# Patient Record
Sex: Female | Born: 1980 | Race: Black or African American | Hispanic: No | Marital: Single | Smoking: Current some day smoker
Health system: Southern US, Community
[De-identification: ages and names within clinical notes are randomized; demographics above are authoritative.]

## PROBLEM LIST (undated history)

## (undated) DIAGNOSIS — E559 Vitamin D deficiency, unspecified: Secondary | ICD-10-CM

## (undated) HISTORY — PX: ABDOMINAL HYSTERECTOMY: SHX81

## (undated) HISTORY — PX: HYSTEROTOMY: SHX1776

---

## 2015-12-15 ENCOUNTER — Emergency Department (HOSPITAL_COMMUNITY)
Admission: EM | Admit: 2015-12-15 | Discharge: 2015-12-15 | Disposition: A | Discharge status: Home / Self Care | Payer: Managed Care, Other (non HMO) | Attending: Emergency Medicine | Admitting: Emergency Medicine

## 2015-12-15 ENCOUNTER — Emergency Department (HOSPITAL_COMMUNITY): Payer: Managed Care, Other (non HMO)

## 2015-12-15 ENCOUNTER — Encounter (HOSPITAL_COMMUNITY): Payer: Self-pay | Admitting: Emergency Medicine

## 2015-12-15 DIAGNOSIS — Z3202 Encounter for pregnancy test, result negative: Secondary | ICD-10-CM | POA: Diagnosis not present

## 2015-12-15 DIAGNOSIS — R519 Headache, unspecified: Secondary | ICD-10-CM

## 2015-12-15 DIAGNOSIS — F17200 Nicotine dependence, unspecified, uncomplicated: Secondary | ICD-10-CM | POA: Diagnosis not present

## 2015-12-15 DIAGNOSIS — R51 Headache: Secondary | ICD-10-CM | POA: Diagnosis not present

## 2015-12-15 DIAGNOSIS — J3489 Other specified disorders of nose and nasal sinuses: Secondary | ICD-10-CM | POA: Insufficient documentation

## 2015-12-15 LAB — CBC WITH DIFFERENTIAL/PLATELET
BASOS ABS: 0 10*3/uL (ref 0.0–0.1)
BASOS PCT: 0 %
EOS PCT: 1 %
Eosinophils Absolute: 0.1 10*3/uL (ref 0.0–0.7)
HEMATOCRIT: 47.4 % — AB (ref 36.0–46.0)
Hemoglobin: 15.4 g/dL — ABNORMAL HIGH (ref 12.0–15.0)
Lymphocytes Relative: 9 %
Lymphs Abs: 0.8 10*3/uL (ref 0.7–4.0)
MCH: 27.5 pg (ref 26.0–34.0)
MCHC: 32.5 g/dL (ref 30.0–36.0)
MCV: 84.5 fL (ref 78.0–100.0)
MONO ABS: 0.9 10*3/uL (ref 0.1–1.0)
Monocytes Relative: 10 %
NEUTROS ABS: 7.6 10*3/uL (ref 1.7–7.7)
Neutrophils Relative %: 80 %
PLATELETS: 285 10*3/uL (ref 150–400)
RBC: 5.61 MIL/uL — ABNORMAL HIGH (ref 3.87–5.11)
RDW: 15.1 % (ref 11.5–15.5)
WBC: 9.4 10*3/uL (ref 4.0–10.5)

## 2015-12-15 LAB — BASIC METABOLIC PANEL
ANION GAP: 12 (ref 5–15)
BUN: 10 mg/dL (ref 6–20)
CALCIUM: 10 mg/dL (ref 8.9–10.3)
CO2: 29 mmol/L (ref 22–32)
Chloride: 99 mmol/L — ABNORMAL LOW (ref 101–111)
Creatinine, Ser: 0.82 mg/dL (ref 0.44–1.00)
Glucose, Bld: 90 mg/dL (ref 65–99)
Potassium: 4.1 mmol/L (ref 3.5–5.1)
Sodium: 140 mmol/L (ref 135–145)

## 2015-12-15 LAB — I-STAT BETA HCG BLOOD, ED (MC, WL, AP ONLY)

## 2015-12-15 LAB — I-STAT CG4 LACTIC ACID, ED: Lactic Acid, Venous: 1.52 mmol/L (ref 0.5–2.0)

## 2015-12-15 MED ORDER — SODIUM CHLORIDE 0.9 % IV BOLUS (SEPSIS)
1000.0000 mL | Freq: Once | INTRAVENOUS | Status: AC
  Administered 2015-12-15: 1000 mL via INTRAVENOUS

## 2015-12-15 MED ORDER — PROCHLORPERAZINE EDISYLATE 5 MG/ML IJ SOLN
10.0000 mg | Freq: Once | INTRAMUSCULAR | Status: AC
  Administered 2015-12-15: 10 mg via INTRAVENOUS
  Filled 2015-12-15: qty 2

## 2015-12-15 MED ORDER — DEXAMETHASONE SODIUM PHOSPHATE 10 MG/ML IJ SOLN
10.0000 mg | Freq: Once | INTRAMUSCULAR | Status: AC
  Administered 2015-12-15: 10 mg via INTRAVENOUS
  Filled 2015-12-15: qty 1

## 2015-12-15 MED ORDER — BUTALBITAL-APAP-CAFFEINE 50-325-40 MG PO TABS: 1.0000 | ORAL_TABLET | Freq: Four times a day (QID) | ORAL | Status: DC | PRN

## 2015-12-15 NOTE — ED Notes (Signed)
Pt reports HA for the past week. Saw PCP who diagnosed her with tension HAs, was given prescription for muscle relaxers, but it did not help. Went to UC yesterday, who diagnosed her with a sinus infection. Started on abx and a pain medication. Last took new prescription at 1000. Pt was driving in her car a hour ago when she started feeling sob and dizzy along with a feeling of throat swelling. Pt was given 50mg  benedryl by fire prior to EMS arrival which resolved new symptoms. Pt still having HA worse on the R side accompanied by light and sound sensitivity.

## 2015-12-15 NOTE — ED Notes (Signed)
Bed: WA27 Expected date:  Expected time:  Means of arrival:  Comments: EMS- sinus infection

## 2015-12-15 NOTE — Discharge Instructions (Signed)
Use nasal saline (you can try [employer] Simply Saline) at least 4 times a day, use saline 5-10 minutes before using the fluticasone (flonase) nasal spray  Do not use Afrin (Oxymetazoline)  Rest, wash hands frequently  and drink plenty of water.  You may try counter medication such as Mucinex or Sudafed decongestant.  Please follow with your primary care doctor in the next 2 days for a check-up. They must obtain records for further management.   Do not hesitate to return to the Emergency Department for any new, worsening or concerning symptoms.    Sinus Headache A sinus headache occurs when the paranasal sinuses become clogged or swollen. Paranasal sinuses are air pockets within the bones of the face. Sinus headaches can range from mild to severe. CAUSES A sinus headache can result from various conditions that affect the sinuses, such as:  Colds.  Sinus infections.  Allergies. SYMPTOMS The main symptom of this condition is a headache that may feel like pain or pressure in the face, forehead, ears, or upper teeth. People who have a sinus headache often have other symptoms, such as:  Congested or runny nose.  Fever.  Inability to smell. Weather changes can make symptoms worse. DIAGNOSIS This condition may be diagnosed based on:  A physical exam and medical history.  Imaging tests, such as a CT scan and MRI, to check for problems with the sinuses.  A specialist may look into the sinuses with a tool that has a camera (endoscopy). TREATMENT Treatment for this condition depends on the cause.  Sinus pain that is caused by a sinus infection may be treated with antibiotic medicine.  Sinus pain that is caused by allergies may be helped by allergy medicines (antihistamines) and medicated nasal sprays.  Sinus pain that is caused by congestion may be helped by flushing the nose and sinuses with saline solution. HOME CARE INSTRUCTIONS  Take medicines only as directed by your  health care provider.  If you were prescribed an antibiotic medicine, finish all of it even if you start to feel better.  If you have congestion, use a nasal spray to help reduce pressure.  If directed, apply a warm, moist washcloth to your face to help relieve pain. SEEK MEDICAL CARE IF:  You have headaches more than one time each week.  You have sensitivity to light or sound.  You have a fever.  You feel sick to your stomach (nauseous) or you throw up (vomit).  Your headaches do not get better with treatment. Many people think that they have a sinus headache when they actually have migraines or tension headaches. SEEK IMMEDIATE MEDICAL CARE IF:  You have vision problems.  You have sudden, severe pain in your face or head.  You have a seizure.  You are confused.  You have a stiff neck.   This information is not intended to replace advice given to you by your health care provider. Make sure you discuss any questions you have with your health care provider.   Document Released: 01/14/2005 Document Revised: 04/23/2015 Document Reviewed: 12/03/2014 Elsevier Interactive Patient Education [employer]2016 [employer]vier Inc.

## 2015-12-15 NOTE — ED Provider Notes (Signed)
CSN: [id number]     Arrival date & time 12/15/15  1710 History  By signing my name below, I, [facility], attest that this documentation has been prepared under the direction and in the presence of [employer], PA-C. Electronically Signed: Randell Patient, ED Scribe. 12/15/2015. 5:54 PM.    Chief Complaint  Patient presents with  . Headache   The history is provided by the patient. No language interpreter was used.   HPI Comments: Kristen Ramirez is a 34 y.o. female who presents to the Emergency Department complaining of a "10/10", right-sided HA onset 2 weeks ago, worse today. She states that the HA moves and is currently located in the back of the neck, right-side of the head, and behind the right eye. Patient endorses photophobia, sinus pressure, neck pain, nasal congestion, non-productive cough, SOB 1.5 hours ago, and chills despite covering up. Pain is unchanged with pressure but worse when leaning forward. Patient notes similar HAs in the past but reports that current HA is worse in severity. Per patient, she reports that she was seen 1 week ago by her PCP who diagnosed her with tension HAs and was prescribed muscle relaxers which did not relieve her symptoms. She states that she was seen at an urgent care 1 day ago, diagnosed with a sinus infection, and prescribed antibiotics. She has taken the antibiotics without relief. Patient notes that she is concerned she is going to have stroke and die.   History reviewed. No pertinent past medical history. Past Surgical History  Procedure Laterality Date  . Abdominal hysterectomy     History reviewed. No pertinent family history. Social History  Substance Use Topics  . Smoking status: Current Some Day Smoker  . Smokeless tobacco: None  . Alcohol Use: Yes   OB History    No data available     Review of Systems A complete 10 system review of systems was obtained and all systems are negative except as noted in the HPI and  PMH.    Allergies  Review of patient's allergies indicates no known allergies.  Home Medications   Prior to Admission medications   Medication Sig Start Date End Date Taking? Authorizing Provider  butalbital-acetaminophen-caffeine (FIORICET) 50-325-40 MG tablet Take 1 tablet by mouth every 6 (six) hours as needed for headache. 12/15/15   Tyleigh Mahn, PA-C   BP 129/75 mmHg  Pulse 108  Temp(Src) 99.8 F (37.7 C) (Oral)  Resp 16  SpO2 100% Physical Exam  Constitutional: She is oriented to person, place, and time. She appears well-developed and well-nourished.  HENT:  Head: Normocephalic and atraumatic.  Mouth/Throat: Oropharynx is clear and moist.  No drooling or stridor. Posterior pharynx mildly erythematous no significant tonsillar hypertrophy. No exudate. Soft palate rises symmetrically. No TTP or induration under tongue.   Mild tenderness to palpation of bilateral maxillary sinuses.  No mucosal edema in the nares.  Bilateral tympanic membranes with normal architecture and good light reflex.    Eyes: Conjunctivae and EOM are normal. Pupils are equal, round, and reactive to light.  No TTP of maxillary or frontal sinuses  No TTP or induration of temporal arteries bilaterally  Neck: Normal range of motion. Neck supple.  FROM to C-spine. Pt can touch chin to chest without discomfort. No TTP of midline cervical spine.   Cardiovascular: Normal rate, regular rhythm and intact distal pulses.   Pulmonary/Chest: Effort normal and breath sounds normal. No respiratory distress. She has no wheezes. She has no rales. She exhibits  no tenderness.  Abdominal: Soft. Bowel sounds are normal. There is no tenderness.  Musculoskeletal: Normal range of motion. She exhibits no edema or tenderness.  Neurological: She is alert and oriented to person, place, and time. No cranial nerve deficit.  II-Visual fields grossly intact. III/IV/VI-Extraocular movements intact.  Pupils reactive  bilaterally. V/VII-Smile symmetric, equal eyebrow raise,  facial sensation intact VIII- Hearing grossly intact IX/X-Normal gag XI-bilateral shoulder shrug XII-midline tongue extension Motor: 5/5 bilaterally with normal tone and bulk Cerebellar: Normal finger-to-nose  and normal heel-to-shin test.   Romberg negative Ambulates with a coordinated gait   Nursing note and vitals reviewed.   ED Course  Procedures   DIAGNOSTIC STUDIES: Oxygen Saturation is 100% on RA, normal by my interpretation.    COORDINATION OF CARE: 5:50 PM Will order head CT and labs. Discussed treatment plan with pt at bedside and pt agreed to plan.  Labs Review Labs Reviewed  CBC WITH DIFFERENTIAL/PLATELET - Abnormal; Notable for the following:    RBC 5.61 (*)    Hemoglobin 15.4 (*)    HCT 47.4 (*)    All other components within normal limits  BASIC METABOLIC PANEL - Abnormal; Notable for the following:    Chloride 99 (*)    All other components within normal limits  I-STAT CG4 LACTIC ACID, ED  I-STAT BETA HCG BLOOD, ED ([facility], [facility], [facility] ONLY)    Imaging Review Ct Head Wo Contrast  12/15/2015  CLINICAL DATA:  Headache for the last week. EXAM: CT HEAD WITHOUT CONTRAST TECHNIQUE: Contiguous axial images were obtained from the base of the skull through the vertex without intravenous contrast. COMPARISON:  06/24/2013 FINDINGS: Brain: No evidence of acute infarction, hemorrhage, extra-axial collection, ventriculomegaly, or mass effect. Vascular: No hyperdense vessel or unexpected calcification. Skull: Negative for fracture or focal lesion. Sinuses/Orbits: Partial opacification of the left mastoid air cells. Paranasal sinuses and right mastoid air cells are normally aerated. Other: None. IMPRESSION: No acute intracranial abnormality. Partial opacification of the left mastoid air cells, likely infectious or inflammatory. Electronically Signed   By: Ted Mcalpineobrinka  Dimitrova M.D.   On: 12/15/2015 19:02   I have personally  reviewed and evaluated these images and lab results as part of my medical decision-making.   EKG Interpretation None      MDM   Final diagnoses:  Sinus headache    Filed Vitals:   12/15/15 1721  BP: 129/75  Pulse: 108  Temp: 99.8 F (37.7 C)  TempSrc: Oral  Resp: 16  SpO2: 100%    Medications  sodium chloride 0.9 % bolus 1,000 mL (1,000 mLs Intravenous New Bag/Given 12/15/15 1928)  prochlorperazine (COMPAZINE) injection 10 mg (10 mg Intravenous Given 12/15/15 1820)  dexamethasone (DECADRON) injection 10 mg (10 mg Intravenous Given 12/15/15 1928)    Kristen Ramirez is 34 y.o. female presenting with persistent headache over the course of 2 weeks with chills, sinus congestion and starting to feel short of breath and dizzy. Patient is concerned that she is having a stroke. Reassured her with normal neurological exam and she still has concerns, will CT head and check basic blood work. Headache improved with Compazine and fluids. We'll also give Decadron, CT consistent with a maxillary sinus infection, she's taking Ceftin ear, she's only completed 2 of the 10 day course. I've advised her to push fluids, CBC seems hemoconcentrated.  Evaluation does not show pathology that would require ongoing emergent intervention or inpatient treatment. Pt is hemodynamically stable and mentating appropriately. Discussed findings and plan with  patient/guardian, who agrees with care plan. All questions answered. Return precautions discussed and outpatient follow up given.   New Prescriptions   BUTALBITAL-ACETAMINOPHEN-CAFFEINE (FIORICET) 50-325-40 MG TABLET    Take 1 tablet by mouth every 6 (six) hours as needed for headache.   I personally performed the services described in this documentation, which was scribed in my presence.  The recorded information has been reviewed and is accurate.   Wynetta Emery, PA-C 12/15/15 1955  Arby Barrette, MD 12/28/15 [phone]

## 2016-12-31 ENCOUNTER — Encounter (HOSPITAL_COMMUNITY): Payer: Self-pay

## 2016-12-31 ENCOUNTER — Emergency Department (HOSPITAL_COMMUNITY)
Admission: EM | Admit: 2016-12-31 | Discharge: 2016-12-31 | Disposition: A | Discharge status: Home / Self Care | Payer: Managed Care, Other (non HMO) | Attending: Dermatology | Admitting: Dermatology

## 2016-12-31 DIAGNOSIS — Z5321 Procedure and treatment not carried out due to patient leaving prior to being seen by health care provider: Secondary | ICD-10-CM | POA: Insufficient documentation

## 2016-12-31 DIAGNOSIS — R51 Headache: Secondary | ICD-10-CM | POA: Insufficient documentation

## 2016-12-31 HISTORY — DX: Vitamin D deficiency, unspecified: E55.9

## 2016-12-31 NOTE — ED Notes (Signed)
Pt tired of waiting elft

## 2016-12-31 NOTE — ED Triage Notes (Signed)
Pt states that she was at school and started having a numbness sensation in the back of her head, that sensation went to the front of her head and into her eye. Pt states that the feeling caused her to have a panic attack.

## 2020-03-21 ENCOUNTER — Emergency Department (HOSPITAL_COMMUNITY)
Admission: EM | Admit: 2020-03-21 | Discharge: 2020-03-21 | Disposition: A | Discharge status: Home / Self Care | Payer: Managed Care, Other (non HMO) | Attending: Emergency Medicine | Admitting: Emergency Medicine

## 2020-03-21 ENCOUNTER — Other Ambulatory Visit: Payer: Self-pay

## 2020-03-21 ENCOUNTER — Emergency Department (HOSPITAL_COMMUNITY): Payer: Managed Care, Other (non HMO)

## 2020-03-21 ENCOUNTER — Encounter (HOSPITAL_COMMUNITY): Payer: Self-pay | Admitting: Emergency Medicine

## 2020-03-21 DIAGNOSIS — R1010 Upper abdominal pain, unspecified: Secondary | ICD-10-CM | POA: Insufficient documentation

## 2020-03-21 DIAGNOSIS — F17200 Nicotine dependence, unspecified, uncomplicated: Secondary | ICD-10-CM | POA: Diagnosis not present

## 2020-03-21 LAB — I-STAT BETA HCG BLOOD, ED (MC, WL, AP ONLY): I-stat hCG, quantitative: <5 m[IU]/mL (ref <5)

## 2020-03-21 LAB — COMPREHENSIVE METABOLIC PANEL
ALT: 13 U/L (ref 0–44)
AST: 17 U/L (ref 15–41)
Albumin: 4.5 g/dL (ref 3.5–5.0)
Alkaline Phosphatase: 98 U/L (ref 38–126)
Anion gap: 10 (ref 5–15)
BUN: 15 mg/dL (ref 6–20)
CO2: 27 mmol/L (ref 22–32)
Calcium: 9.8 mg/dL (ref 8.9–10.3)
Chloride: 105 mmol/L (ref 98–111)
Creatinine, Ser: 0.66 mg/dL (ref 0.44–1.00)
GFR calc Af Amer: >60 mL/min (ref >60)
GFR calc non Af Amer: >60 mL/min (ref >60)
Glucose, Bld: 102 mg/dL — ABNORMAL HIGH (ref 70–99)
Potassium: 4.1 mmol/L (ref 3.5–5.1)
Sodium: 142 mmol/L (ref 135–145)
Total Bilirubin: 0.4 mg/dL (ref 0.3–1.2)
Total Protein: 7.5 g/dL (ref 6.5–8.1)

## 2020-03-21 LAB — LIPASE, BLOOD: Lipase: 32 U/L (ref 11–51)

## 2020-03-21 LAB — CBC
HCT: 41.8 % (ref 36.0–46.0)
Hemoglobin: 13.2 g/dL (ref 12.0–15.0)
MCH: 26.7 pg (ref 26.0–34.0)
MCHC: 31.6 g/dL (ref 30.0–36.0)
MCV: 84.6 fL (ref 80.0–100.0)
Platelets: 297 10*3/uL (ref 150–400)
RBC: 4.94 MIL/uL (ref 3.87–5.11)
RDW: 15.9 % — ABNORMAL HIGH (ref 11.5–15.5)
WBC: 9.2 10*3/uL (ref 4.0–10.5)
nRBC: 0 % (ref 0.0–0.2)

## 2020-03-21 LAB — URINALYSIS, ROUTINE W REFLEX MICROSCOPIC
Bilirubin Urine: NEGATIVE
Glucose, UA: NEGATIVE mg/dL
Hgb urine dipstick: NEGATIVE
Ketones, ur: NEGATIVE mg/dL
Leukocytes,Ua: NEGATIVE
Nitrite: NEGATIVE
Protein, ur: NEGATIVE mg/dL
Specific Gravity, Urine: 1.019 (ref 1.005–1.030)
pH: 6 (ref 5.0–8.0)

## 2020-03-21 MED ORDER — SUCRALFATE 1 G PO TABS: 1.0000 g | ORAL_TABLET | Freq: Three times a day (TID) | ORAL | 0 refills | Status: DC

## 2020-03-21 MED ORDER — FAMOTIDINE 20 MG PO TABS: 20.0000 mg | ORAL_TABLET | Freq: Two times a day (BID) | ORAL | 0 refills | Status: DC

## 2020-03-21 MED ORDER — FAMOTIDINE IN NACL 20-0.9 MG/50ML-% IV SOLN
20.0000 mg | Freq: Once | INTRAVENOUS | Status: AC
  Administered 2020-03-21: 20 mg via INTRAVENOUS
  Filled 2020-03-21: qty 50

## 2020-03-21 MED ORDER — SODIUM CHLORIDE (PF) 0.9 % IJ SOLN
INTRAMUSCULAR | Status: AC
  Filled 2020-03-21: qty 50

## 2020-03-21 MED ORDER — IOHEXOL 300 MG/ML  SOLN
100.0000 mL | Freq: Once | INTRAMUSCULAR | Status: AC | PRN
  Administered 2020-03-21: 100 mL via INTRAVENOUS

## 2020-03-21 MED ORDER — LIDOCAINE VISCOUS HCL 2 % MT SOLN
15.0000 mL | Freq: Once | OROMUCOSAL | Status: AC
  Administered 2020-03-21: 15 mL via ORAL
  Filled 2020-03-21: qty 15

## 2020-03-21 MED ORDER — ALUM & MAG HYDROXIDE-SIMETH 200-200-20 MG/5ML PO SUSP
30.0000 mL | Freq: Once | ORAL | Status: AC
  Administered 2020-03-21: 30 mL via ORAL
  Filled 2020-03-21: qty 30

## 2020-03-21 MED ORDER — SODIUM CHLORIDE 0.9% FLUSH: 3.0000 mL | Freq: Once | INTRAVENOUS | Status: DC

## 2020-03-21 NOTE — Discharge Instructions (Addendum)
1. Medications: Start taking Pepcid twice daily with meals.  You can also take Carafate with meals and at night which helps coat the stomach to protect it. 2. Treatment: rest, drink plenty of fluids, advance diet slowly.  Avoid spicy foods, fried foods, fatty foods, or alcohol.  Avoid eating too close to bedtime, usually recommend 2 hours between your last meal and bedtime. You can also elevate the head of the bed.  3. Follow Up: Please followup with your primary doctor in 3 days for discussion of your diagnoses and further evaluation after today's visit; if you do not have a primary care doctor use the resource guide provided to find one; Please return to the ER for persistent vomiting, high fevers or worsening symptoms

## 2020-03-21 NOTE — ED Provider Notes (Signed)
[facility]-EMERGENCY DEPT Provider Note   CSN: [id number] Arrival date & time: 03/21/20  1301     History Chief Complaint  Patient presents with  . Abdominal Pain    Kristen Ramirez is a 39 y.o. female with history of vitamin D deficiency presents for evaluation of acute onset, persistent upper abdominal pain for 2 weeks.  She has a difficult time describing the pain but states that it sometimes worsens after meals.  She denies nausea, vomiting, or diarrhea.  She denies urinary symptoms, fevers, chest pain, shortness of breath.  Denies suspicious food intake, known sick contacts recent travel.  She has tried an herbal supplement for her symptoms with no relief.  She went to urgent care and was sent here for further evaluation.   The history is provided by the patient.       Past Medical History:  Diagnosis Date  . Vitamin D deficiency     There are no problems to display for this patient.   Past Surgical History:  Procedure Laterality Date  . ABDOMINAL HYSTERECTOMY    . HYSTEROTOMY       OB History   No obstetric history on file.     No family history on file.  Social History   Tobacco Use  . Smoking status: Current Some Day Smoker  . Smokeless tobacco: Never Used  Substance Use Topics  . Alcohol use: Yes  . Drug use: No    Home Medications Prior to Admission medications   Medication Sig Start Date End Date Taking? Authorizing Provider  butalbital-acetaminophen-caffeine (FIORICET) 50-325-40 MG tablet Take 1 tablet by mouth every 6 (six) hours as needed for headache. 12/15/15   Pisciotta, Joni Reining, PA-C  famotidine (PEPCID) 20 MG tablet Take 1 tablet (20 mg total) by mouth 2 (two) times daily. 03/21/20   Uliana Brinker A, PA-C  sucralfate (CARAFATE) 1 g tablet Take 1 tablet (1 g total) by mouth 4 (four) times daily -  with meals and at bedtime. 03/21/20   Michela Pitcher A, PA-C    Allergies    Patient has no known allergies.  Review of  Systems   Review of Systems  Constitutional: Negative for chills and fever.  Respiratory: Negative for shortness of breath.   Cardiovascular: Negative for chest pain.  Gastrointestinal: Positive for abdominal pain and constipation (baseline for patient). Negative for diarrhea, nausea and vomiting.  Genitourinary: Negative for dysuria, frequency, hematuria and urgency.  All other systems reviewed and are negative.   Physical Exam Updated Vital Signs BP 137/87 (BP Location: Left Arm)   Pulse 80   Temp 98.6 F (37 C) (Oral)   Resp 16   SpO2 100%   Physical Exam Vitals and nursing note reviewed.  Constitutional:      General: She is not in acute distress.    Appearance: She is well-developed.  HENT:     Head: Normocephalic and atraumatic.  Eyes:     General:        Right eye: No discharge.        Left eye: No discharge.     Conjunctiva/sclera: Conjunctivae normal.  Neck:     Vascular: No JVD.     Trachea: No tracheal deviation.  Cardiovascular:     Rate and Rhythm: Normal rate and regular rhythm.  Pulmonary:     Effort: Pulmonary effort is normal.     Breath sounds: Normal breath sounds.  Abdominal:     General: Bowel sounds are  normal. There is no distension.     Palpations: Abdomen is soft.     Tenderness: There is abdominal tenderness in the right upper quadrant, epigastric area and left upper quadrant. There is no right CVA tenderness, left CVA tenderness, guarding or rebound. Negative signs include Murphy's sign, Rovsing's sign and McBurney's sign.     Comments: Mild abdominal discomfort on palpation  Skin:    General: Skin is warm and dry.     Findings: No erythema.  Neurological:     Mental Status: She is alert.  Psychiatric:        Behavior: Behavior normal.     ED Results / Procedures / Treatments   Labs (all labs ordered are listed, but only abnormal results are displayed) Labs Reviewed  COMPREHENSIVE METABOLIC PANEL - Abnormal; Notable for the  following components:      Result Value   Glucose, Bld 102 (*)    All other components within normal limits  CBC - Abnormal; Notable for the following components:   RDW 15.9 (*)    All other components within normal limits  LIPASE, BLOOD  URINALYSIS, ROUTINE W REFLEX MICROSCOPIC  I-STAT BETA HCG BLOOD, ED ([facility], [facility], [facility] ONLY)    EKG None  Radiology CT ABDOMEN PELVIS W CONTRAST  Result Date: 03/21/2020 CLINICAL DATA:  Right upper quadrant pain, generalized pain for 2 weeks EXAM: CT ABDOMEN AND PELVIS WITH CONTRAST TECHNIQUE: Multidetector CT imaging of the abdomen and pelvis was performed using the standard protocol following bolus administration of intravenous contrast. CONTRAST:  OMNIPAQUE IOHEXOL 300 MG/ML  SOLN COMPARISON:  None. FINDINGS: Lower chest: No acute pleural or parenchymal lung disease. Hepatobiliary: No focal liver abnormality is seen. No gallstones, gallbladder wall thickening, or biliary dilatation. Pancreas: Unremarkable. No pancreatic ductal dilatation or surrounding inflammatory changes. Spleen: Normal in size without focal abnormality. Adrenals/Urinary Tract: Nonspecific left adrenal thickening measures 14 mm. The right adrenal is unremarkable. The kidneys enhance normally and symmetrically. No urinary tract calculi or obstructive uropathy. The bladder is unremarkable. Stomach/Bowel: Normal gas-filled appendix right lower quadrant. No bowel obstruction or ileus. Vascular/Lymphatic: No significant vascular findings are present. No enlarged abdominal or pelvic lymph nodes. Reproductive: Status post hysterectomy. No adnexal masses. Other: No abdominal wall hernia or abnormality. No abdominopelvic ascites. Musculoskeletal: No acute or destructive bony lesions. Reconstructed images demonstrate no additional findings. IMPRESSION: 1. No urinary tract calculi or obstructive uropathy. 2. Nonspecific left adrenal thickening measures 14 mm. Statistically this would likely reflect an  adenoma. If further imaging is clinically indicated, nonemergent MRI could be considered. Electronically Signed   By: Sharlet Salina M.D.   On: 03/21/2020 18:29    Procedures Procedures (including critical care time)  Medications Ordered in ED Medications  sodium chloride flush (NS) 0.9 % injection 3 mL (has no administration in time range)  sodium chloride (PF) 0.9 % injection (has no administration in time range)  famotidine (PEPCID) IVPB 20 mg premix (0 mg Intravenous Stopped 03/21/20 1800)  iohexol (OMNIPAQUE) 300 MG/ML solution 100 mL (100 mLs Intravenous Contrast Given 03/21/20 1805)  alum & mag hydroxide-simeth (MAALOX/MYLANTA) 200-200-20 MG/5ML suspension 30 mL (30 mLs Oral Given 03/21/20 1843)    And  lidocaine (XYLOCAINE) 2 % viscous mouth solution 15 mL (15 mLs Oral Given 03/21/20 1843)    ED Course  I have reviewed the triage vital signs and the nursing notes.  Pertinent labs & imaging results that were available during my care of the patient were reviewed by me  and considered in my medical decision making (see chart for details).    MDM Rules/Calculators/A&P                      Patient presenting for evaluation of 2 weeks of upper abdominal pain.  No other associated symptoms.  She signs are stable.  She is nontoxic in appearance.  No rebound or guarding noted on examination of the abdomen.  Lab work reviewed and interpreted by myself shows no leukocytosis, no anemia, no metabolic derangements, no renal insufficiency.  UA does not suggest UTI or nephrolithiasis.  She has no GU complaints and I doubt ectopic pregnancy, PID, TOA or ovarian torsion.  Imaging today shows no evidence of acute surgical abdominal pathology.  She was given GI cocktail and Pepcid in the ED on reevaluation reports she is feeling better.  She is tolerating sips of fluids in the ED and serial abdominal examinations remain benign.  She does have a PCP with whom she can follow-up.  I suspect that she may have GERD  versus peptic ulcer disease possibly as her symptoms worsen with meals.  We will start her on a course of Carafate and Pepcid.  We discussed lifestyle modifications and dietary modifications.  She will follow-up with her PCP on an outpatient basis for reevaluation of her symptoms.  Discussed strict ED return precautions.  Patient verbalized understanding of and agreement with plan and stable for discharge at this time.   Final Clinical Impression(s) / ED Diagnoses Final diagnoses:  Pain of upper abdomen    Rx / DC Orders ED Discharge Orders         Ordered    famotidine (PEPCID) 20 MG tablet  2 times daily     03/21/20 1921    sucralfate (CARAFATE) 1 g tablet  3 times daily with meals & bedtime     03/21/20 1921           Renita Papa, PA-C 03/21/20 1924    Deno Etienne, DO 03/21/20 2003

## 2020-03-21 NOTE — ED Notes (Signed)
Pt transported to CT ?

## 2020-03-21 NOTE — ED Triage Notes (Addendum)
Per patient, states she has been having generalized abdominal pain for 2 weeks-went to UC but they sent her here-no N/V/D

## 2020-04-01 ENCOUNTER — Ambulatory Visit: Payer: Managed Care, Other (non HMO) | Attending: Internal Medicine

## 2020-04-01 DIAGNOSIS — Z20822 Contact with and (suspected) exposure to covid-19: Secondary | ICD-10-CM

## 2020-04-02 LAB — NOVEL CORONAVIRUS, NAA: SARS-CoV-2, NAA: DETECTED — AB

## 2020-04-02 LAB — SARS-COV-2, NAA 2 DAY TAT

## 2020-04-10 ENCOUNTER — Other Ambulatory Visit: Payer: Self-pay

## 2020-04-10 ENCOUNTER — Ambulatory Visit: Payer: Managed Care, Other (non HMO)

## 2020-12-09 ENCOUNTER — Emergency Department (HOSPITAL_COMMUNITY): Payer: Managed Care, Other (non HMO)

## 2020-12-09 ENCOUNTER — Encounter (HOSPITAL_COMMUNITY): Payer: Self-pay | Admitting: Emergency Medicine

## 2020-12-09 ENCOUNTER — Other Ambulatory Visit: Payer: Self-pay

## 2020-12-09 ENCOUNTER — Emergency Department (HOSPITAL_COMMUNITY)
Admission: EM | Admit: 2020-12-09 | Discharge: 2020-12-09 | Disposition: A | Discharge status: Home / Self Care | Payer: Managed Care, Other (non HMO) | Attending: Emergency Medicine | Admitting: Emergency Medicine

## 2020-12-09 DIAGNOSIS — F17200 Nicotine dependence, unspecified, uncomplicated: Secondary | ICD-10-CM | POA: Insufficient documentation

## 2020-12-09 DIAGNOSIS — R519 Headache, unspecified: Secondary | ICD-10-CM

## 2020-12-09 DIAGNOSIS — M542 Cervicalgia: Secondary | ICD-10-CM | POA: Insufficient documentation

## 2020-12-09 MED ORDER — KETOROLAC TROMETHAMINE 60 MG/2ML IM SOLN
60.0000 mg | Freq: Once | INTRAMUSCULAR | Status: AC
  Administered 2020-12-09: 60 mg via INTRAMUSCULAR
  Filled 2020-12-09: qty 2

## 2020-12-09 MED ORDER — TRAMADOL HCL 50 MG PO TABS: 50.0000 mg | ORAL_TABLET | Freq: Four times a day (QID) | ORAL | 0 refills | Status: DC | PRN

## 2020-12-09 MED ORDER — OXYCODONE-ACETAMINOPHEN 5-325 MG PO TABS
2.0000 | ORAL_TABLET | Freq: Once | ORAL | Status: DC
  Filled 2020-12-09: qty 2

## 2020-12-09 MED ORDER — DIAZEPAM 5 MG PO TABS
5.0000 mg | ORAL_TABLET | Freq: Once | ORAL | Status: AC
  Administered 2020-12-09: 22:00:00 5 mg via ORAL
  Filled 2020-12-09: qty 1

## 2020-12-09 MED ORDER — METHOCARBAMOL 500 MG PO TABS: 500.0000 mg | ORAL_TABLET | Freq: Two times a day (BID) | ORAL | 0 refills | Status: DC

## 2020-12-09 NOTE — ED Triage Notes (Signed)
Patient headache with light sensitivity and intermittent nausea x3 weeks. Denies vomiting, fevers, and cough.

## 2020-12-09 NOTE — ED Provider Notes (Signed)
Kristen Ramirez   CSN: [id number] Arrival date & time: 12/09/20  1843     History Chief Complaint  Patient presents with  . Headache    Kristen Ramirez is a 39 y.o. female.  39 year old female presents with 2 weeks of headache which starts at the left side of her neck and radiates to her occiput.  States that it is positional.  Works as a [employer] and uses her left arm and awkward positions.  Denies any nausea or vomiting.  No left hand weakness.  Patient does see a chiropractor and got adjusted 3 days ago.  Pain is better with remaining still.  She is also been using Excedrin without relief.  Denies any fever.  No meningismus.        Past Medical History:  Diagnosis Date  . Vitamin D deficiency     There are no problems to display for this patient.   Past Surgical History:  Procedure Laterality Date  . ABDOMINAL HYSTERECTOMY    . HYSTEROTOMY       OB History   No obstetric history on file.     No family history on file.  Social History   Tobacco Use  . Smoking status: Current Some Day Smoker  . Smokeless tobacco: Never Used  Substance Use Topics  . Alcohol use: Yes  . Drug use: No    Home Medications Prior to Admission medications   Medication Sig Start Date End Date Taking? Authorizing Provider  butalbital-acetaminophen-caffeine (FIORICET) 50-325-40 MG tablet Take 1 tablet by mouth every 6 (six) hours as needed for headache. 12/15/15   Pisciotta, Joni Reining, PA-C  famotidine (PEPCID) 20 MG tablet Take 1 tablet (20 mg total) by mouth 2 (two) times daily. 03/21/20   Fawze, Mina A, PA-C  sucralfate (CARAFATE) 1 g tablet Take 1 tablet (1 g total) by mouth 4 (four) times daily -  with meals and at bedtime. 03/21/20   Michela Pitcher A, PA-C    Allergies    Patient has no known allergies.  Review of Systems   Review of Systems  All other systems reviewed and are negative.   Physical Exam Updated Vital Signs BP  139/90 (BP Location: Left Arm)   Pulse 69   Temp 97.9 F (36.6 C) (Oral)   Resp 19   SpO2 100%   Physical Exam Vitals and nursing Ramirez reviewed.  Constitutional:      General: She is not in acute distress.    Appearance: Normal appearance. She is well-developed and well-nourished. She is not toxic-appearing.  HENT:     Head: Normocephalic and atraumatic.  Eyes:     General: Lids are normal.     Extraocular Movements: EOM normal.     Conjunctiva/sclera: Conjunctivae normal.     Pupils: Pupils are equal, round, and reactive to light.  Neck:     Thyroid: No thyroid mass.     Trachea: No tracheal deviation.   Cardiovascular:     Rate and Rhythm: Normal rate and regular rhythm.     Heart sounds: Normal heart sounds. No murmur heard. No gallop.   Pulmonary:     Effort: Pulmonary effort is normal. No respiratory distress.     Breath sounds: Normal breath sounds. No stridor. No decreased breath sounds, wheezing, rhonchi or rales.  Abdominal:     General: Bowel sounds are normal. There is no distension.     Palpations: Abdomen is soft.     Tenderness:  There is no abdominal tenderness. There is no CVA tenderness or rebound.  Musculoskeletal:        General: No tenderness or edema. Normal range of motion.     Cervical back: Normal range of motion and neck supple. Muscular tenderness present. No spinous process tenderness.  Skin:    General: Skin is warm and dry.     Findings: No abrasion or rash.  Neurological:     Mental Status: She is alert and oriented to person, place, and time.     GCS: GCS eye subscore is 4. GCS verbal subscore is 5. GCS motor subscore is 6.     Cranial Nerves: No cranial nerve deficit.     Sensory: No sensory deficit.     Deep Tendon Reflexes: Strength normal.  Psychiatric:        Mood and Affect: Mood and affect normal.        Speech: Speech normal.        Behavior: Behavior normal.     ED Results / Procedures / Treatments   Labs (all labs ordered  are listed, but only abnormal results are displayed) Labs Reviewed - No data to display  EKG None  Radiology No results found.  Procedures Procedures (including critical care time)  Medications Ordered in ED Medications  oxyCODONE-acetaminophen (PERCOCET/ROXICET) 5-325 MG per tablet 2 tablet (has no administration in time range)    ED Course  I have reviewed the triage vital signs and the nursing notes.  Pertinent labs & imaging results that were available during my care of the patient were reviewed by me and considered in my medical decision making (see chart for details).    MDM Rules/Calculators/A&P                          Patient medicated for pain here.  CT scanning of head neck negative.  Will discharge home Final Clinical Impression(s) / ED Diagnoses Final diagnoses:  None    Rx / DC Orders ED Discharge Orders    None       Lorre Nick, MD 12/09/20 2149

## 2020-12-25 ENCOUNTER — Emergency Department (HOSPITAL_COMMUNITY)
Admission: EM | Admit: 2020-12-25 | Discharge: 2020-12-25 | Disposition: A | Discharge status: Home / Self Care | Payer: Managed Care, Other (non HMO) | Attending: Emergency Medicine | Admitting: Emergency Medicine

## 2020-12-25 ENCOUNTER — Emergency Department (HOSPITAL_COMMUNITY): Payer: Managed Care, Other (non HMO)

## 2020-12-25 ENCOUNTER — Encounter (HOSPITAL_COMMUNITY): Payer: Self-pay

## 2020-12-25 ENCOUNTER — Other Ambulatory Visit: Payer: Self-pay

## 2020-12-25 DIAGNOSIS — F17200 Nicotine dependence, unspecified, uncomplicated: Secondary | ICD-10-CM | POA: Insufficient documentation

## 2020-12-25 DIAGNOSIS — R1010 Upper abdominal pain, unspecified: Secondary | ICD-10-CM | POA: Diagnosis present

## 2020-12-25 DIAGNOSIS — R7401 Elevation of levels of liver transaminase levels: Secondary | ICD-10-CM | POA: Insufficient documentation

## 2020-12-25 LAB — URINALYSIS, ROUTINE W REFLEX MICROSCOPIC
Bilirubin Urine: NEGATIVE
Glucose, UA: NEGATIVE mg/dL
Hgb urine dipstick: NEGATIVE
Ketones, ur: NEGATIVE mg/dL
Leukocytes,Ua: NEGATIVE
Nitrite: NEGATIVE
Protein, ur: NEGATIVE mg/dL
Specific Gravity, Urine: 1.003 — ABNORMAL LOW (ref 1.005–1.030)
pH: 6 (ref 5.0–8.0)

## 2020-12-25 LAB — CBC
HCT: 38.5 % (ref 36.0–46.0)
Hemoglobin: 12.5 g/dL (ref 12.0–15.0)
MCH: 26.5 pg (ref 26.0–34.0)
MCHC: 32.5 g/dL (ref 30.0–36.0)
MCV: 81.7 fL (ref 80.0–100.0)
Platelets: 264 10*3/uL (ref 150–400)
RBC: 4.71 MIL/uL (ref 3.87–5.11)
RDW: 15.3 % (ref 11.5–15.5)
WBC: 13.3 10*3/uL — ABNORMAL HIGH (ref 4.0–10.5)
nRBC: 0 % (ref 0.0–0.2)

## 2020-12-25 LAB — LIPASE, BLOOD: Lipase: 26 U/L (ref 11–51)

## 2020-12-25 LAB — COMPREHENSIVE METABOLIC PANEL
ALT: 279 U/L — ABNORMAL HIGH (ref 0–44)
AST: 527 U/L — ABNORMAL HIGH (ref 15–41)
Albumin: 4.2 g/dL (ref 3.5–5.0)
Alkaline Phosphatase: 142 U/L — ABNORMAL HIGH (ref 38–126)
Anion gap: 11 (ref 5–15)
BUN: 10 mg/dL (ref 6–20)
CO2: 25 mmol/L (ref 22–32)
Calcium: 9.3 mg/dL (ref 8.9–10.3)
Chloride: 102 mmol/L (ref 98–111)
Creatinine, Ser: 0.8 mg/dL (ref 0.44–1.00)
GFR, Estimated: >60 mL/min (ref >60)
Glucose, Bld: 103 mg/dL — ABNORMAL HIGH (ref 70–99)
Potassium: 4.1 mmol/L (ref 3.5–5.1)
Sodium: 138 mmol/L (ref 135–145)
Total Bilirubin: 0.4 mg/dL (ref 0.3–1.2)
Total Protein: 7.7 g/dL (ref 6.5–8.1)

## 2020-12-25 LAB — HEPATITIS PANEL, ACUTE
HCV Ab: NONREACTIVE
Hep A IgM: NONREACTIVE
Hep B C IgM: NONREACTIVE
Hepatitis B Surface Ag: NONREACTIVE

## 2020-12-25 LAB — ACETAMINOPHEN LEVEL: Acetaminophen (Tylenol), Serum: <10 ug/mL — ABNORMAL LOW (ref 10–30)

## 2020-12-25 MED ORDER — IOHEXOL 300 MG/ML  SOLN
100.0000 mL | Freq: Once | INTRAMUSCULAR | Status: AC | PRN
  Administered 2020-12-25: 100 mL via INTRAVENOUS

## 2020-12-25 NOTE — ED Triage Notes (Signed)
Patient arrived stating she is having LUQ pain over the last 6 hours, reports tender to touch. Reports some nausea. Declines any OTC medication.

## 2020-12-25 NOTE — ED Provider Notes (Signed)
[facility] DEPT Provider Note   CSN: [id number] Arrival date & time: 12/25/20  0543     History Chief Complaint  Patient presents with  . Abdominal Pain    Kristen Ramirez is a 40 y.o. female.  Patient with history of hysterectomy presents the emergency department for evaluation of upper abdominal pain.  Patient states that the pain began earlier this morning.  It began in the epigastrium and then has since moved to the left upper quadrant.  She is unable to describe the pain just stating that "it is a pain".  She feels it mildly in her back.  No fevers.  She has had some nausea but no vomiting.  She has had a soft nonbloody bowel movement.  No urinary symptoms, vaginal bleeding or discharge.  No treatments prior to arrival.  She states that bumps in the car made the pain worse.  She does report using over-the-counter Excedrin every other day, no other NSAIDs.  She does drink alcohol, last use about a week ago.  Covid vaccine 12/19/2020.         Past Medical History:  Diagnosis Date  . Vitamin D deficiency     There are no problems to display for this patient.   Past Surgical History:  Procedure Laterality Date  . ABDOMINAL HYSTERECTOMY    . HYSTEROTOMY       OB History   No obstetric history on file.     No family history on file.  Social History   Tobacco Use  . Smoking status: Current Some Day Smoker  . Smokeless tobacco: Never Used  Substance Use Topics  . Alcohol use: Yes  . Drug use: No    Home Medications Prior to Admission medications   Medication Sig Start Date End Date Taking? Authorizing Provider  butalbital-acetaminophen-caffeine (FIORICET) 50-325-40 MG tablet Take 1 tablet by mouth every 6 (six) hours as needed for headache. 12/15/15   Pisciotta, Elmyra Ricks, PA-C  famotidine (PEPCID) 20 MG tablet Take 1 tablet (20 mg total) by mouth 2 (two) times daily. 03/21/20   Fawze, Mina A, PA-C  methocarbamol (ROBAXIN) 500 MG tablet  Take 1 tablet (500 mg total) by mouth 2 (two) times daily. 12/09/20   Lacretia Leigh, MD  sucralfate (CARAFATE) 1 g tablet Take 1 tablet (1 g total) by mouth 4 (four) times daily -  with meals and at bedtime. 03/21/20   Fawze, Mina A, PA-C  traMADol (ULTRAM) 50 MG tablet Take 1 tablet (50 mg total) by mouth every 6 (six) hours as needed. 12/09/20   Lacretia Leigh, MD    Allergies    Oxycodone  Review of Systems   Review of Systems  Constitutional: Negative for fever.  HENT: Negative for rhinorrhea and sore throat.   Eyes: Negative for redness.  Respiratory: Negative for cough.   Cardiovascular: Negative for chest pain.  Gastrointestinal: Positive for abdominal pain and nausea. Negative for diarrhea and vomiting.  Genitourinary: Negative for dysuria, frequency, hematuria and urgency.  Musculoskeletal: Negative for myalgias.  Skin: Negative for rash.  Neurological: Negative for headaches.    Physical Exam Updated Vital Signs BP (!) 135/91   Pulse 85   Temp 98.7 F (37.1 C) (Oral)   Resp 18   Ht 5\' 6"  (1.676 m)   Wt 90.7 kg   SpO2 100%   BMI 32.28 kg/m   Physical Exam Vitals and nursing note reviewed.  Constitutional:      General: She is not in  acute distress.    Appearance: She is well-developed.  HENT:     Head: Normocephalic and atraumatic.     Right Ear: External ear normal.     Left Ear: External ear normal.     Nose: Nose normal.  Eyes:     Conjunctiva/sclera: Conjunctivae normal.  Cardiovascular:     Rate and Rhythm: Normal rate and regular rhythm.     Heart sounds: No murmur heard.   Pulmonary:     Effort: No respiratory distress.     Breath sounds: No wheezing, rhonchi or rales.  Abdominal:     Palpations: Abdomen is soft.     Tenderness: There is abdominal tenderness (Mild) in the epigastric area and left upper quadrant. There is no guarding or rebound. Negative signs include Murphy's sign and McBurney's sign.  Musculoskeletal:     Cervical back:  Normal range of motion and neck supple.     Right lower leg: No edema.     Left lower leg: No edema.  Skin:    General: Skin is warm and dry.     Findings: No rash.  Neurological:     General: No focal deficit present.     Mental Status: She is alert. Mental status is at baseline.     Motor: No weakness.  Psychiatric:        Mood and Affect: Mood normal.     ED Results / Procedures / Treatments   Labs (all labs ordered are listed, but only abnormal results are displayed) Labs Reviewed  CBC - Abnormal; Notable for the following components:      Result Value   WBC 13.3 (*)    All other components within normal limits  URINALYSIS, ROUTINE W REFLEX MICROSCOPIC - Abnormal; Notable for the following components:   Color, Urine STRAW (*)    Specific Gravity, Urine 1.003 (*)    All other components within normal limits  LIPASE, BLOOD  COMPREHENSIVE METABOLIC PANEL    EKG None  Radiology No results found.  Procedures Procedures (including critical care time)  Medications Ordered in ED Medications - No data to display  ED Course  I have reviewed the triage vital signs and the nursing notes.  Pertinent labs & imaging results that were available during my care of the patient were reviewed by me and considered in my medical decision making (see chart for details).  Patient seen and examined. Work-up initiated. Offered pain medication, she declines.   Vital signs reviewed and are as follows: BP (!) 135/91   Pulse 85   Temp 98.7 F (37.1 C) (Oral)   Resp 18   Ht 5\' 6"  (1.676 m)   Wt 90.7 kg   SpO2 100%   BMI 32.28 kg/m   7:47 AM patient updated on results this point.  We discussed multiple different etiologies of her symptoms.  Given pain, will obtain CT scan.  We will send Tylenol and hepatitis testing.  Patient does state that she has had a little bit of a cold recently and took off of the DayQuil yesterday morning.  Otherwise no other sources of Tylenol.  She does  report receiving the Covid vaccine last week.  10:19 AM patient was rechecked several times.  Patient was originally informed of elevated liver enzyme testing and elevated white blood cell count.  Discussed utility of CT imaging of the upper abdomen to ensure no signs of infection or other problems which could explain these findings.  She agrees to proceed.  CT performed without signs of infection, blockages, gallbladder or biliary problems.  Hepatitis panel ordered and acetaminophen levels ordered.  Acetaminophen is normal.  Patient is stable.  Pain is controlled and she has not required any analgesia.  She has not vomited.  No fevers.  Plan for discharged home with close PCP follow-up.  She is also given referral to gastroenterology.  I discussed at length with patient to avoid any medications which contain acetaminophen and to avoid alcohol at this point.  Discussed importance of follow-up.  We discussed signs symptoms which caused her to return including fever, worsening uncontrolled pain, uncontrolled vomiting, if she notices yellow coloring of her eyes or skin.     MDM Rules/Calculators/A&P                          Patient with upper abdominal pain and transaminitis.  Patient has a normal bilirubin and lipase.  Labs, imaging, and exam do not really suggest an obstructive etiology.  No fever to suggest cholangitis.  Patient does report recent alcohol use but not in the past several days.  Alcoholic hepatitis is possible.  Viral hepatitis panel is pending.  Patient has had nonexcessive use of acetaminophen recently with a negative Tylenol level today.  I do not suspect acetaminophen toxicity.  This would be an unusual side effect of the Covid vaccine which she received last week.  Otherwise her pain is controlled and she is not vomiting.  I do not feel that she requires admission to the hospital or emergent gastroenterology consult today.   She looks well and nontoxic in appearance.  She will need  close follow-up with her primary care and with GI.  Discussed signs and symptoms which would cause her to return immediately to the hospital.   Final Clinical Impression(s) / ED Diagnoses Final diagnoses:  Transaminitis  Upper abdominal pain    Rx / DC Orders ED Discharge Orders    None       Renne Crigler, Cordelia Poche 12/25/20 1022    Benjiman Core, MD 12/25/20 1552

## 2020-12-25 NOTE — Discharge Instructions (Signed)
Please read and follow all provided instructions.  Your diagnoses today include:  1. Transaminitis   2. Upper abdominal pain     Tests performed today include:  Blood cell counts and platelets -- high white blood cell count  Kidney and liver function tests -- high liver function tests  Pancreas function test (called lipase) -- normal pancreas test  Urine test to look for infection  Hepatitis testing - is pending  CT scan of your abdomen pelvis -does not show any emergency or dangerous causes of your pain or elevated liver enzymes.  Vital signs. See below for your results today.   Medications prescribed:   None  Take any prescribed medications only as directed.  Home care instructions:   Follow any educational materials contained in this packet.  Follow-up instructions:  Please follow-up with your primary care provider in the next 2 days for further evaluation of your symptoms.    Please let them know that you had to come to the emergency department and that your liver function tests were high today.  These will need to be followed and rechecked by your primary care doctor.  You are also given a referral to gastroenterology and encouraged to call them for a follow-up appointment to look into this further.  Please avoid all forms of acetaminophen, also known as Tylenol, as well as alcohol until your liver tests returned to normal.  You should return to the emergency department with uncontrolled pain or vomiting, if you notice that your eyes or skin become yellow in color.   Return instructions:  SEEK IMMEDIATE MEDICAL ATTENTION IF:  The pain does not go away or becomes severe   If your skin or eyes begin to look yellow in color  A temperature above 101F develops   Repeated vomiting occurs (multiple episodes)   The pain becomes localized to portions of the abdomen. The right side could possibly be appendicitis. In an adult, the left lower portion of the abdomen  could be colitis or diverticulitis.   Blood is being passed in stools or vomit (bright red or black tarry stools)   You develop chest pain, difficulty breathing, dizziness or fainting, or become confused, poorly responsive, or inconsolable (young children)  If you have any other emergent concerns regarding your health  Additional Information: Abdominal (belly) pain can be caused by many things. Your caregiver performed an examination and possibly ordered blood/urine tests and imaging (CT scan, x-rays, ultrasound). Many cases can be observed and treated at home after initial evaluation in the emergency department. Even though you are being discharged home, abdominal pain can be unpredictable. Therefore, you need a repeated exam if your pain does not resolve, returns, or worsens. Most patients with abdominal pain don't have to be admitted to the hospital or have surgery, but serious problems like appendicitis and gallbladder attacks can start out as nonspecific pain. Many abdominal conditions cannot be diagnosed in one visit, so follow-up evaluations are very important.  Your vital signs today were: BP (!) 135/91    Pulse 85    Temp 98.7 F (37.1 C) (Oral)    Resp 18    Ht 5\' 6"  (1.676 m)    Wt 90.7 kg    SpO2 100%    BMI 32.28 kg/m  If your blood pressure (bp) was elevated above 135/85 this visit, please have this repeated by your doctor within one month. --------------

## 2022-02-03 ENCOUNTER — Other Ambulatory Visit: Payer: Self-pay

## 2022-02-03 ENCOUNTER — Encounter (HOSPITAL_COMMUNITY): Payer: Self-pay

## 2022-02-03 ENCOUNTER — Emergency Department (HOSPITAL_COMMUNITY)
Admission: EM | Admit: 2022-02-03 | Discharge: 2022-02-04 | Disposition: A | Discharge status: Home / Self Care | Payer: Commercial Managed Care - PPO | Attending: Emergency Medicine | Admitting: Emergency Medicine

## 2022-02-03 ENCOUNTER — Emergency Department (HOSPITAL_COMMUNITY): Payer: Commercial Managed Care - PPO

## 2022-02-03 DIAGNOSIS — M542 Cervicalgia: Secondary | ICD-10-CM | POA: Diagnosis not present

## 2022-02-03 DIAGNOSIS — R519 Headache, unspecified: Secondary | ICD-10-CM | POA: Diagnosis not present

## 2022-02-03 DIAGNOSIS — Z91010 Allergy to peanuts: Secondary | ICD-10-CM | POA: Diagnosis not present

## 2022-02-03 DIAGNOSIS — R0789 Other chest pain: Secondary | ICD-10-CM | POA: Insufficient documentation

## 2022-02-03 LAB — CBC
HCT: 39.6 % (ref 36.0–46.0)
Hemoglobin: 12.4 g/dL (ref 12.0–15.0)
MCH: 26.6 pg (ref 26.0–34.0)
MCHC: 31.3 g/dL (ref 30.0–36.0)
MCV: 85 fL (ref 80.0–100.0)
Platelets: 291 10*3/uL (ref 150–400)
RBC: 4.66 MIL/uL (ref 3.87–5.11)
RDW: 15.8 % — ABNORMAL HIGH (ref 11.5–15.5)
WBC: 9.4 10*3/uL (ref 4.0–10.5)
nRBC: 0 % (ref 0.0–0.2)

## 2022-02-03 NOTE — ED Triage Notes (Signed)
Pt BIB EMS with reports of headache x 2 weeks but got worse on Thursday. Pt states that she went to UC on Saturday. Pt was awakened out of her sleep tonight with a headache and left sided chest pain that increases with left arm movement.

## 2022-02-03 NOTE — ED Provider Triage Note (Signed)
Emergency Medicine Provider Triage Evaluation Note  Kristen Ramirez , a 41 y.o. female  was evaluated in triage.  Pt complains of headache of 2 weeks duration that woke her up from her sleep today.  Patient was evaluated at urgent care earlier in the course for the same headache and was given medication without improvement.  Patient rates her headache as 9/10.  Denies visual change, fever, nausea, vomiting associated with the headache.  She also reports chest pain since she was woken up tonight.  Shortness of breath at that time which has since improved.  Without palpitations, lightheadedness.  Pain does not radiate however it is worsened with movement of left upper extremity.  Review of Systems  Positive: As above Negative: As above  Physical Exam  BP (!) 145/106    Pulse 82    Temp 98.4 F (36.9 C) (Oral)    Resp 15    Ht 5\' 6"  (1.676 m)    Wt 89.4 kg    SpO2 98%    BMI 31.80 kg/m  Gen:   Awake, no distress   Resp:  Normal effort  MSK:   Moves extremities without difficulty  Other:  Neurological exam without focal deficits.  Medical Decision Making  Medically screening exam initiated at 11:32 PM.  Appropriate orders placed.  KARON COTTERILL was informed that the remainder of the evaluation will be completed by another provider, this initial triage assessment does not replace that evaluation, and the importance of remaining in the ED until their evaluation is complete.     Valinda Party, PA-C 02/03/22 2334

## 2022-02-04 LAB — BASIC METABOLIC PANEL
Anion gap: 6 (ref 5–15)
BUN: 22 mg/dL — ABNORMAL HIGH (ref 6–20)
CO2: 28 mmol/L (ref 22–32)
Calcium: 9.1 mg/dL (ref 8.9–10.3)
Chloride: 105 mmol/L (ref 98–111)
Creatinine, Ser: 0.88 mg/dL (ref 0.44–1.00)
GFR, Estimated: >60 mL/min (ref >60)
Glucose, Bld: 128 mg/dL — ABNORMAL HIGH (ref 70–99)
Potassium: 3.9 mmol/L (ref 3.5–5.1)
Sodium: 139 mmol/L (ref 135–145)

## 2022-02-04 LAB — TROPONIN I (HIGH SENSITIVITY): Troponin I (High Sensitivity): 3 ng/L (ref <18)

## 2022-02-04 MED ORDER — SODIUM CHLORIDE 0.9 % IV BOLUS
1000.0000 mL | Freq: Once | INTRAVENOUS | Status: AC
  Administered 2022-02-04: 1000 mL via INTRAVENOUS

## 2022-02-04 MED ORDER — METOCLOPRAMIDE HCL 5 MG/ML IJ SOLN
5.0000 mg | Freq: Once | INTRAMUSCULAR | Status: AC
  Administered 2022-02-04: 5 mg via INTRAVENOUS
  Filled 2022-02-04: qty 2

## 2022-02-04 MED ORDER — DIPHENHYDRAMINE HCL 50 MG/ML IJ SOLN
25.0000 mg | Freq: Once | INTRAMUSCULAR | Status: AC
  Administered 2022-02-04: 25 mg via INTRAVENOUS
  Filled 2022-02-04: qty 1

## 2022-02-04 MED ORDER — LIDOCAINE 5 % EX PTCH
1.0000 | MEDICATED_PATCH | CUTANEOUS | Status: DC
  Administered 2022-02-04: 1 via TRANSDERMAL
  Filled 2022-02-04: qty 1

## 2022-02-04 MED ORDER — KETOROLAC TROMETHAMINE 15 MG/ML IJ SOLN
15.0000 mg | Freq: Once | INTRAMUSCULAR | Status: AC
  Administered 2022-02-04: 15 mg via INTRAVENOUS
  Filled 2022-02-04: qty 1

## 2022-02-04 MED ORDER — LIDOCAINE 4 % EX PTCH: 1.0000 | MEDICATED_PATCH | Freq: Two times a day (BID) | CUTANEOUS | 0 refills | Status: AC | PRN

## 2022-02-04 NOTE — ED Provider Notes (Signed)
[facility]-EMERGENCY DEPT Provider Note   CSN: [id number] Arrival date & time: 02/03/22  2318     History  Chief Complaint  Patient presents with   Headache    Kristen Ramirez is a 41 y.o. female.  HPI  41 year old female with a history of abdominal hysterectomy, who presents the emergency department today for evaluation of a headache and chest pain.  States the headache has been ongoing for the last 2 weeks.  Pain seems to wax and wane.  The pain starts in the left posterior neck and also involves the bilateral temples.  She has no associated visual changes dizziness or lightheaded numbness.  Denies any unilateral numbness/weakness.  She does have some pain to the left trapezius area and the left chest wall.  The pain is worse with any movement of the left upper extremity.  Denies any recent falls, trauma, heavy lifting or injuries that she is aware of.  She denies any associated shortness of breath, pleuritic pain, leg swelling, calf pain, cough hemoptysis.  Home Medications Prior to Admission medications   Medication Sig Start Date End Date Taking? Authorizing Provider  Multiple Vitamin (MULTIVITAMIN WITH MINERALS) TABS tablet Take 1 tablet by mouth daily.    [provider]  omeprazole (PRILOSEC) 20 MG capsule Take 20 mg by mouth daily.    [provider]  famotidine (PEPCID) 20 MG tablet Take 1 tablet (20 mg total) by mouth 2 (two) times daily. Patient not taking: Reported on 12/25/2020 03/21/20 12/25/20  Michela Pitcher A, PA-C  sucralfate (CARAFATE) 1 g tablet Take 1 tablet (1 g total) by mouth 4 (four) times daily -  with meals and at bedtime. Patient not taking: Reported on 12/25/2020 03/21/20 12/25/20  Michela Pitcher A, PA-C      Allergies    Oxycodone, Peanut-containing drug products, Codeine, Hydrocodone, and Peanut butter flavor    Review of Systems   Review of Systems See HPI for pertinent positives or negatives.   Physical Exam Updated Vital  Signs BP (!) 145/106    Pulse 82    Temp 98.4 F (36.9 C) (Oral)    Resp 15    Ht 5\' 6"  (1.676 m)    Wt 89.4 kg    SpO2 98%    BMI 31.80 kg/m  Physical Exam Vitals and nursing note reviewed.  Constitutional:      General: She is not in acute distress.    Appearance: She is well-developed.  HENT:     Head: Normocephalic and atraumatic.  Eyes:     Conjunctiva/sclera: Conjunctivae normal.  Cardiovascular:     Rate and Rhythm: Normal rate and regular rhythm.     Heart sounds: No murmur heard. Pulmonary:     Effort: Pulmonary effort is normal. No respiratory distress.     Breath sounds: Normal breath sounds.  Chest:     Chest wall: Tenderness (left upper chest wall ttp that reproduces pain) present.  Abdominal:     Palpations: Abdomen is soft.     Tenderness: There is no abdominal tenderness.  Musculoskeletal:        General: No swelling.     Cervical back: Neck supple.     Comments: TTP to the left cervical paraspinous muscles and left trapezius which reproduce pain  Skin:    General: Skin is warm and dry.     Capillary Refill: Capillary refill takes less than 2 seconds.  Neurological:     Mental Status: She is alert.  Comments: Mental Status:  Alert, thought content appropriate, able to give a coherent history. Speech fluent without evidence of aphasia. Able to follow 2 step commands without difficulty.  Cranial Nerves:  II:   pupils equal, round, reactive to light III,IV, VI: ptosis not present, extra-ocular motions intact bilaterally  V,VII: smile symmetric, facial light touch sensation equal VIII: hearing grossly normal to voice  X: uvula elevates symmetrically  XI: bilateral shoulder shrug symmetric and strong XII: midline tongue extension without fassiculations Motor:  Normal tone. 5/5 strength of BUE and BLE major muscle groups including strong and equal grip strength and dorsiflexion/plantar flexion Sensory: light touch normal in all extremities.   Psychiatric:         Mood and Affect: Mood normal.    ED Results / Procedures / Treatments   Labs (all labs ordered are listed, but only abnormal results are displayed) Labs Reviewed  CBC - Abnormal; Notable for the following components:      Result Value   RDW 15.8 (*)    All other components within normal limits  BASIC METABOLIC PANEL - Abnormal; Notable for the following components:   Glucose, Bld 128 (*)    BUN 22 (*)    All other components within normal limits  TROPONIN I (HIGH SENSITIVITY)    EKG None  Radiology DG Chest 2 View  Result Date: 02/03/2022 CLINICAL DATA:  Chest pain. EXAM: CHEST - 2 VIEW COMPARISON:  Chest radiograph dated 09/22/2013. FINDINGS: The heart size and mediastinal contours are within normal limits. Both lungs are clear. The visualized skeletal structures are unremarkable. IMPRESSION: No active cardiopulmonary disease. Electronically Signed   By: Elgie Collard M.D.   On: 02/03/2022 23:53   CT Head Wo Contrast  Result Date: 02/03/2022 CLINICAL DATA:  Headache for 2 weeks EXAM: CT HEAD WITHOUT CONTRAST TECHNIQUE: Contiguous axial images were obtained from the base of the skull through the vertex without intravenous contrast. RADIATION DOSE REDUCTION: This exam was performed according to the departmental dose-optimization program which includes automated exposure control, adjustment of the mA and/or kV according to patient size and/or use of iterative reconstruction technique. COMPARISON:  12/09/2020 FINDINGS: Brain: No acute infarct or hemorrhage. Lateral ventricles and midline structures are unremarkable. No acute extra-axial fluid collections. No mass effect. Vascular: No hyperdense vessel or unexpected calcification. Skull: Normal. Negative for fracture or focal lesion. Sinuses/Orbits: No acute finding. Other: None. IMPRESSION: 1. Stable head CT.  No acute intracranial process. Electronically Signed   By: Sharlet Salina M.D.   On: 02/03/2022 23:59     Procedures Procedures    Medications Ordered in ED Medications  ketorolac (TORADOL) 15 MG/ML injection 15 mg (15 mg Intravenous Given 02/04/22 0635)  metoCLOPramide (REGLAN) injection 5 mg (5 mg Intravenous Given 02/04/22 0635)  diphenhydrAMINE (BENADRYL) injection 25 mg (25 mg Intravenous Given 02/04/22 0635)  sodium chloride 0.9 % bolus 1,000 mL (1,000 mLs Intravenous New Bag/Given 02/04/22 3419)    ED Course/ Medical Decision Making/ A&P                           Medical Decision Making Risk Prescription drug management.   This patient presents to the ED for concern of chest pain, headache, this involves an extensive number of treatment options, and is a complaint that carries with it a high risk of complications and morbidity.  The differential diagnosis includes but is not limited to acs, pe, dissection, esophageal rupture, ich, cva, meningitis,  tension headache, migraine, malignancy   Additional history obtained: Records reviewed Care Everywhere/External Records and Primary Care Documents  Lab Tests: I Ordered, and personally interpreted labs.  The pertinent results include:   CBC unremarkable  BMP unremarkable Trop negative  Imaging Studies ordered: I ordered, independently visualized, and interpreted imaging which showed  CT head - 1. Stable head CT.  No acute intracranial process. CXR - No active cardiopulmonary disease.  I agree with the radiologist interpretation  Critical Interventions: migraine cocktail  Complexity of problems addressed: Patients presentation is most consistent with  acute complicated illness/injury requiring diagnostic workup  At shift change, care transitioned to Francine Graven, PA-C with plan to recheck pt and dispo if improved   Final Clinical Impression(s) / ED Diagnoses Final diagnoses:  Headache disorder  Chest wall pain    Rx / DC Orders ED Discharge Orders     None         Rayne Du 02/04/22  0654    Shon Baton, MD 02/08/22 [phone]

## 2022-02-04 NOTE — ED Provider Notes (Signed)
°  Care of patient assumed from Georgia Couture at [phone].  Agree with history, physical exam and plan.  See their note for further details. Briefly, 41 year old female with a history of abdominal hysterectomy presented to the emergency department with a chief complaint of headache and chest pain.  It was ongoing the last 2 weeks.  Pain has waxed and waned over this time.  Pain starts in the left posterior neck and involves bilateral temples.  Pain to left trapezius and left chest.  No aggravating or relieving factors.  Physical Exam  BP (!) 145/106    Pulse 82    Temp 98.4 F (36.9 C) (Oral)    Resp 15    Ht 5\' 6"  (1.676 m)    Wt 89.4 kg    SpO2 98%    BMI 31.80 kg/m   Physical Exam Vitals and nursing note reviewed.  Constitutional:      General: She is not in acute distress.    Appearance: She is not ill-appearing, toxic-appearing or diaphoretic.  HENT:     Head: Normocephalic.  Eyes:     General: No scleral icterus.       Right eye: No discharge.        Left eye: No discharge.  Cardiovascular:     Rate and Rhythm: Normal rate.  Pulmonary:     Effort: Pulmonary effort is normal.  Musculoskeletal:     Comments: Tenderness to left trapezius muscle with palpation  Skin:    General: Skin is warm and dry.  Neurological:     General: No focal deficit present.     Mental Status: She is alert and oriented to person, place, and time.     GCS: GCS eye subscore is 4. GCS verbal subscore is 5. GCS motor subscore is 6.     Comments: Moves all limbs equally without difficulty.  No dysarthria.  Psychiatric:        Behavior: Behavior is cooperative.    Procedures  Procedures  ED Course / MDM    Medical Decision Making Risk OTC drugs. Prescription drug management.   At time of handoff patient just received migraine cocktail.  Plan to reassess patient after receiving this medication.  On reevaluation patient reports resolution of her headache after migraine cocktail.  Patient continues to  complain of pain to left trapezius muscle.  We will apply lidocaine patch and prescribe patient with the same.  Believe patient would benefit from discharge at this time.  Patient will be following up with her primary care provider in the next few days for repeat evaluation.  Discussed symptomatic treatment with patient.  Discussed results, findings, treatment and follow up. Patient advised of return precautions. Patient verbalized understanding and agreed with plan.      , PA-C 02/04/22 0801    02/06/22, MD 02/04/22 [phone]

## 2022-02-04 NOTE — ED Notes (Signed)
Pt ambulatory to bathroom with no assistance

## 2022-02-04 NOTE — Discharge Instructions (Addendum)

## 2022-05-15 IMAGING — CT CT HEAD W/O CM
3 series · 14 of 47 positions shown, 16 images · non-contrast
Comparison: 12/09/2020

CLINICAL DATA: Headache for 2 weeks



[Series 2: head wo · axial · 0.47mm/px · z∈[-148,-18]mm · 8 of 32 slices shown, 10 images]
[im 3/32  brain]
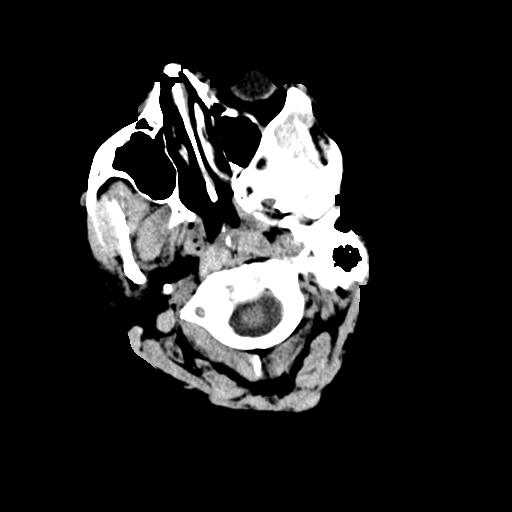
[im 3/32  bone]
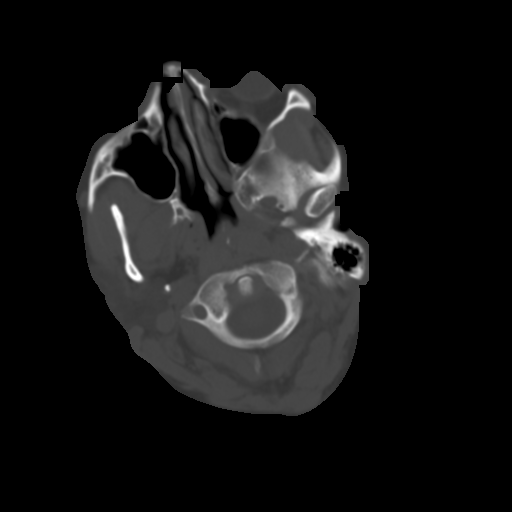
[im 7/32  brain]
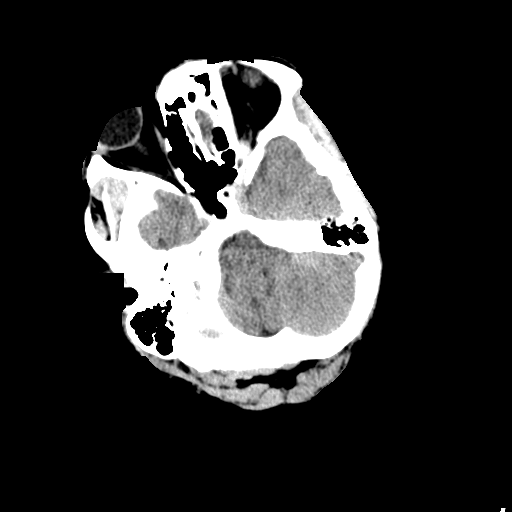
[im 10/32  brain]
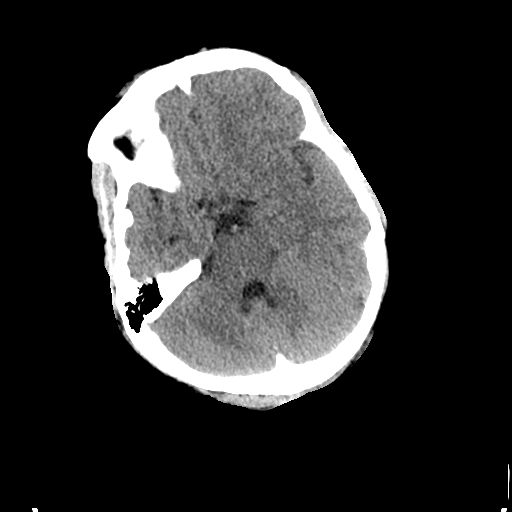
[im 14/32  brain]
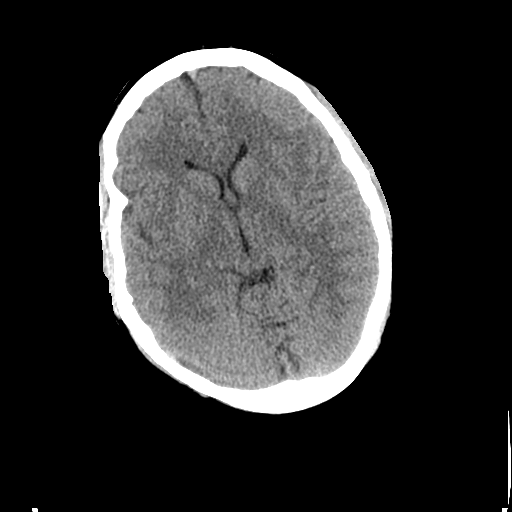
[im 18/32  brain]
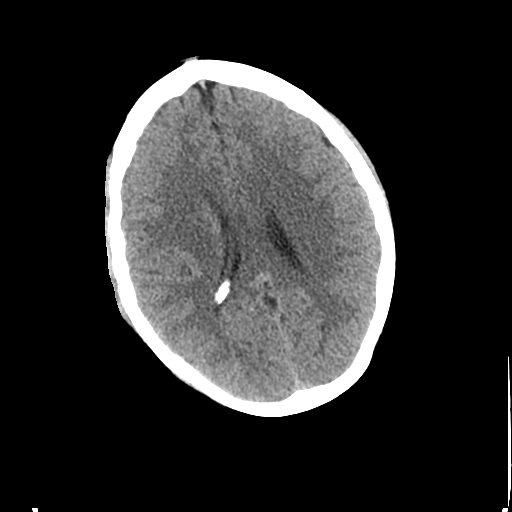
[im 18/32  bone]
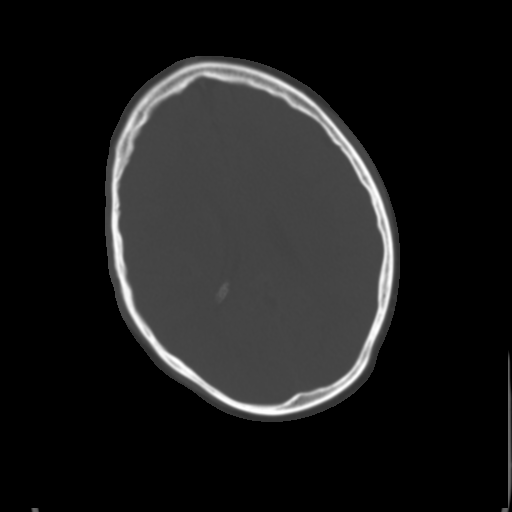
[im 22/32  brain]
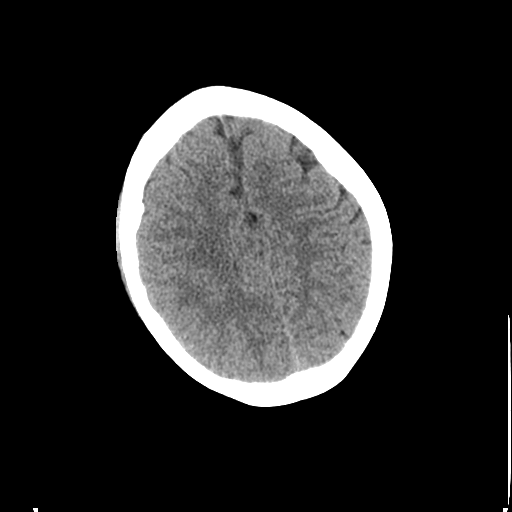
[im 25/32  brain]
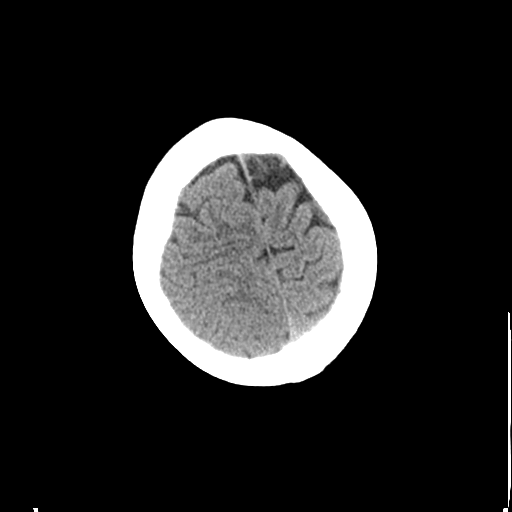
[im 29/32  brain]
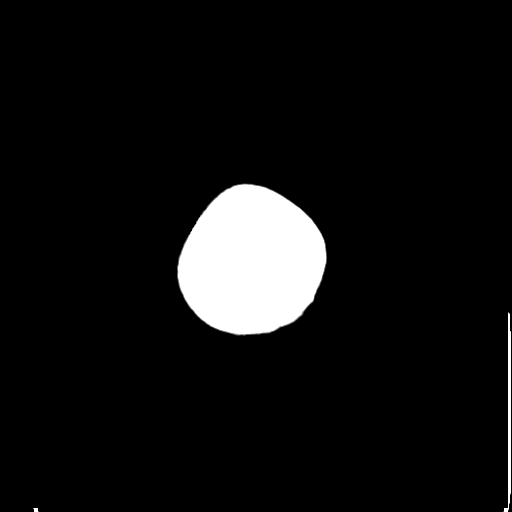

[Series 4: coronal soft tissue · coronal · 0.31mm/px · 3 of 63 slices shown]
[im 21/63  brain]
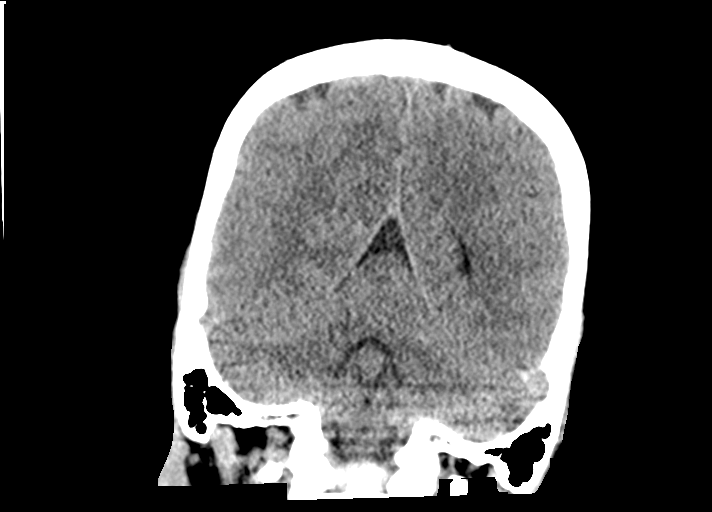
[im 28/63  brain]
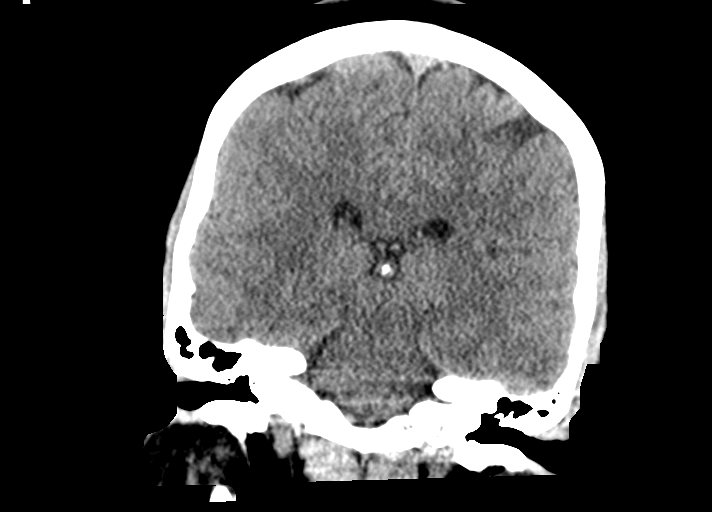
[im 35/63  brain]
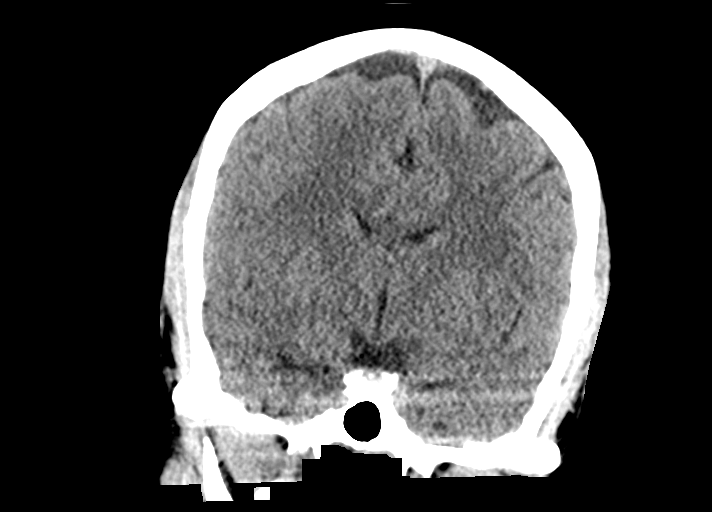

[Series 5: sagittal soft tissue · sagittal · 0.33mm/px · 3 of 51 slices shown]
[im 17/51  brain]
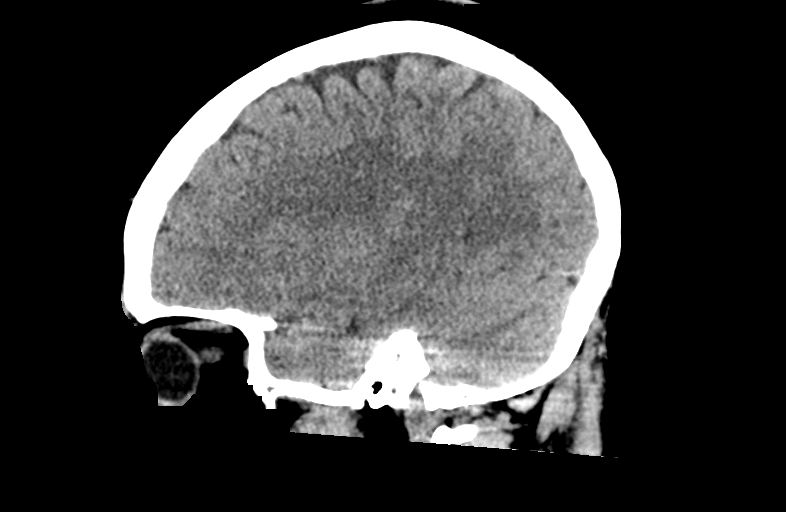
[im 26/51  brain]
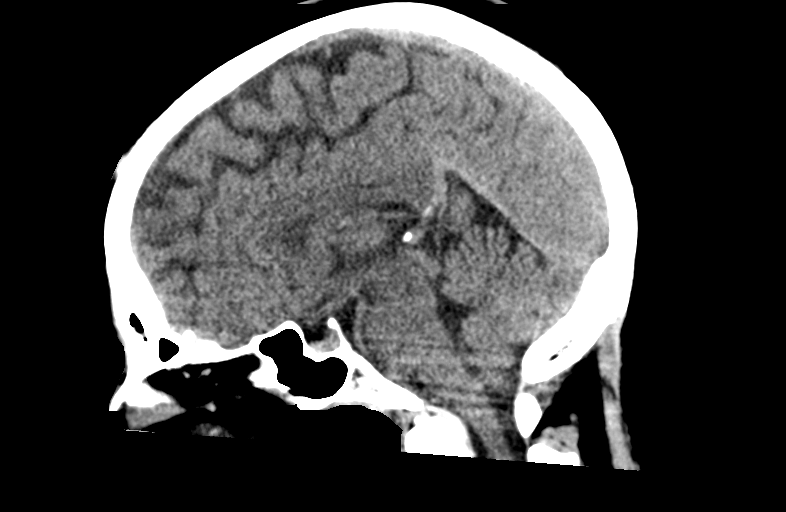
[im 34/51  brain]
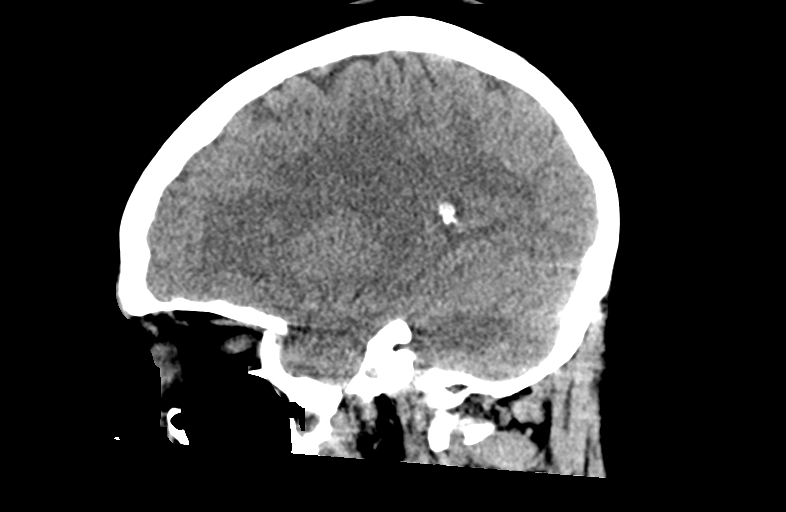

[14 of 47 positions shown; findings below may reference images not displayed]

FINDINGS: Brain: No acute infarct or hemorrhage. Lateral ventricles and
midline structures are unremarkable. No acute extra-axial fluid
collections. No mass effect.

Vascular: No hyperdense vessel or unexpected calcification.

Skull: Normal. Negative for fracture or focal lesion.

Sinuses/Orbits: No acute finding.

Other: None.
IMPRESSION: 1. Stable head CT.  No acute intracranial process.

## 2022-05-15 IMAGING — CR DG CHEST 2V
2 series · 2 of 2 positions shown · non-contrast
Comparison: Chest radiograph dated 09/22/2013.

CLINICAL DATA: Chest pain.

EXAM:
CHEST - 2 VIEW

[w chest pa]
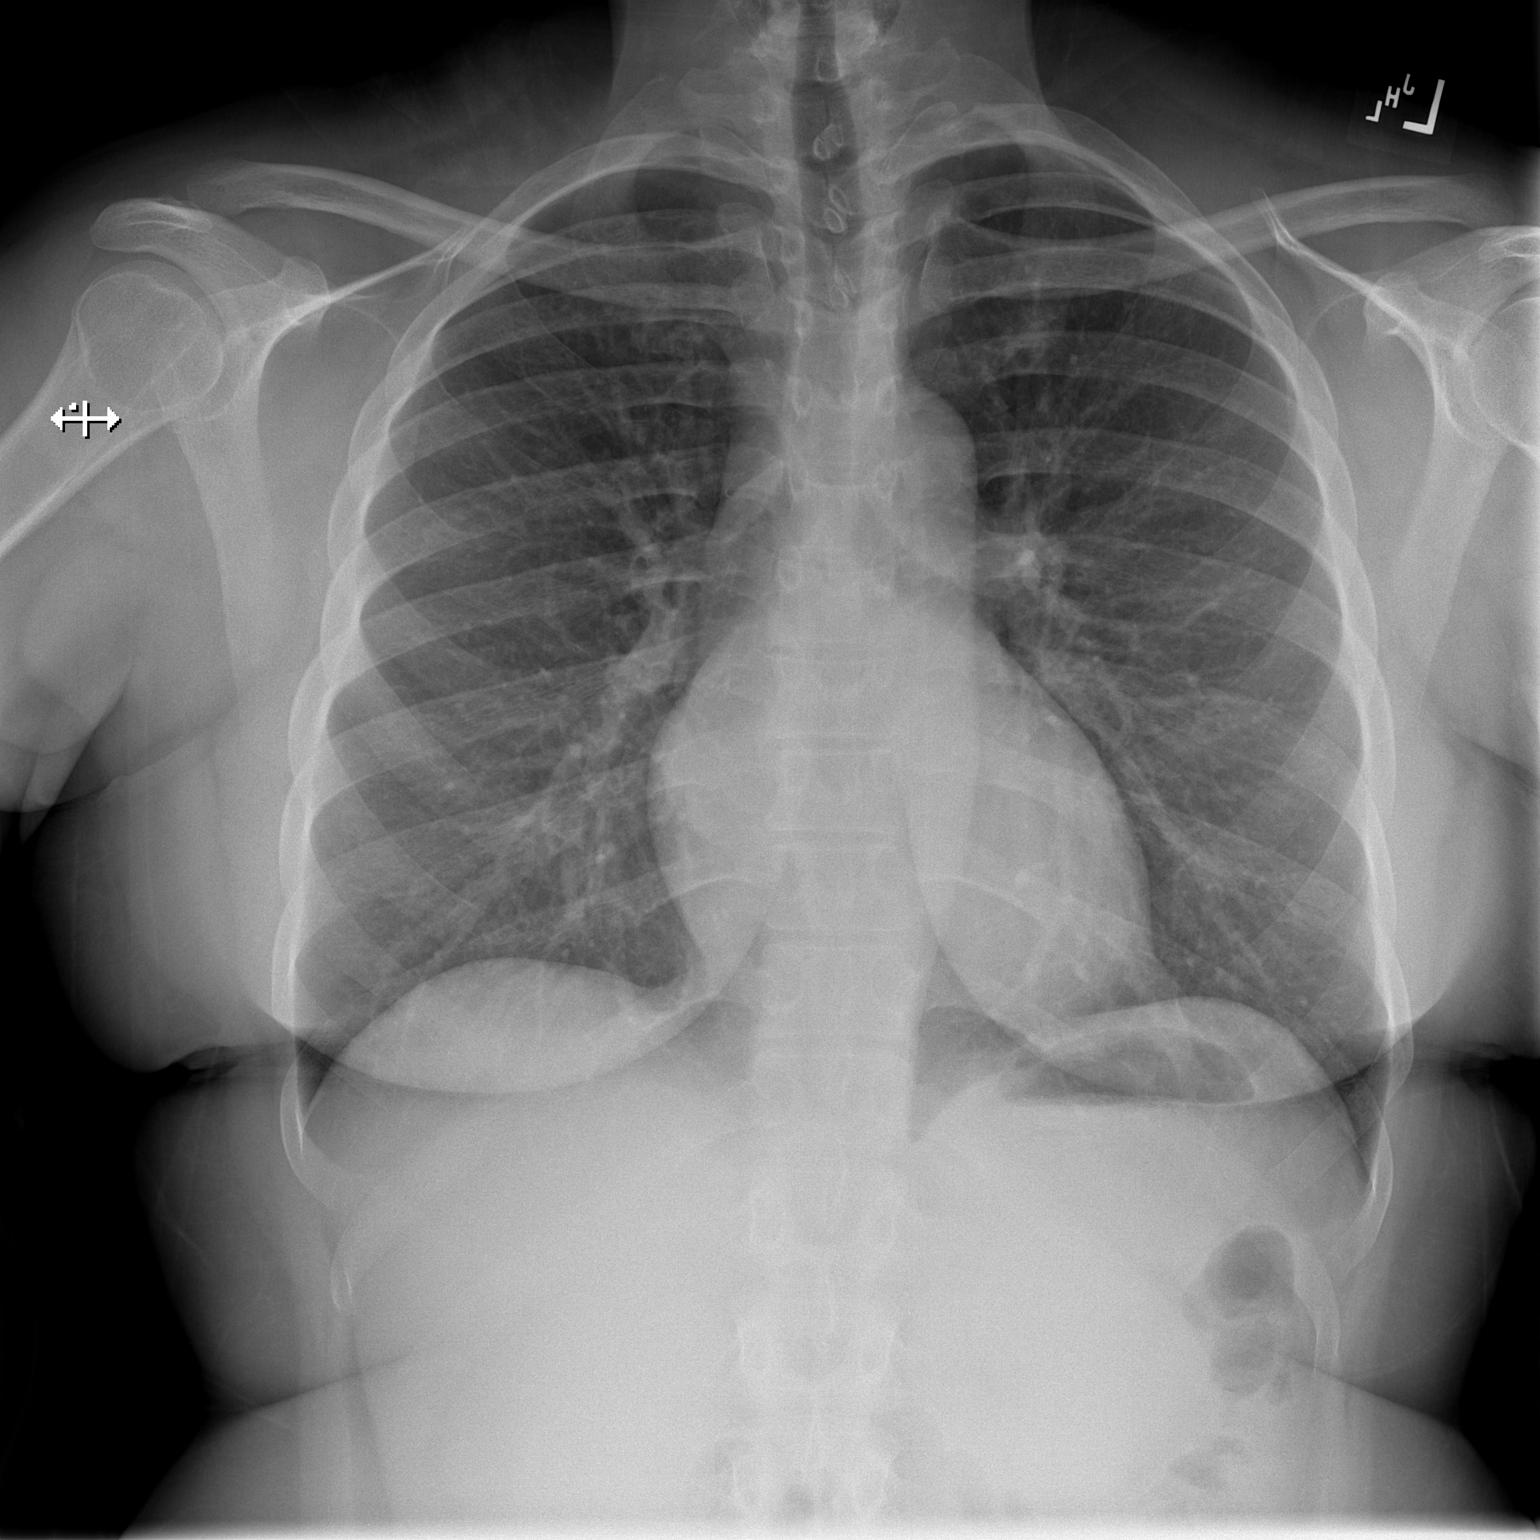

[w chest lat]
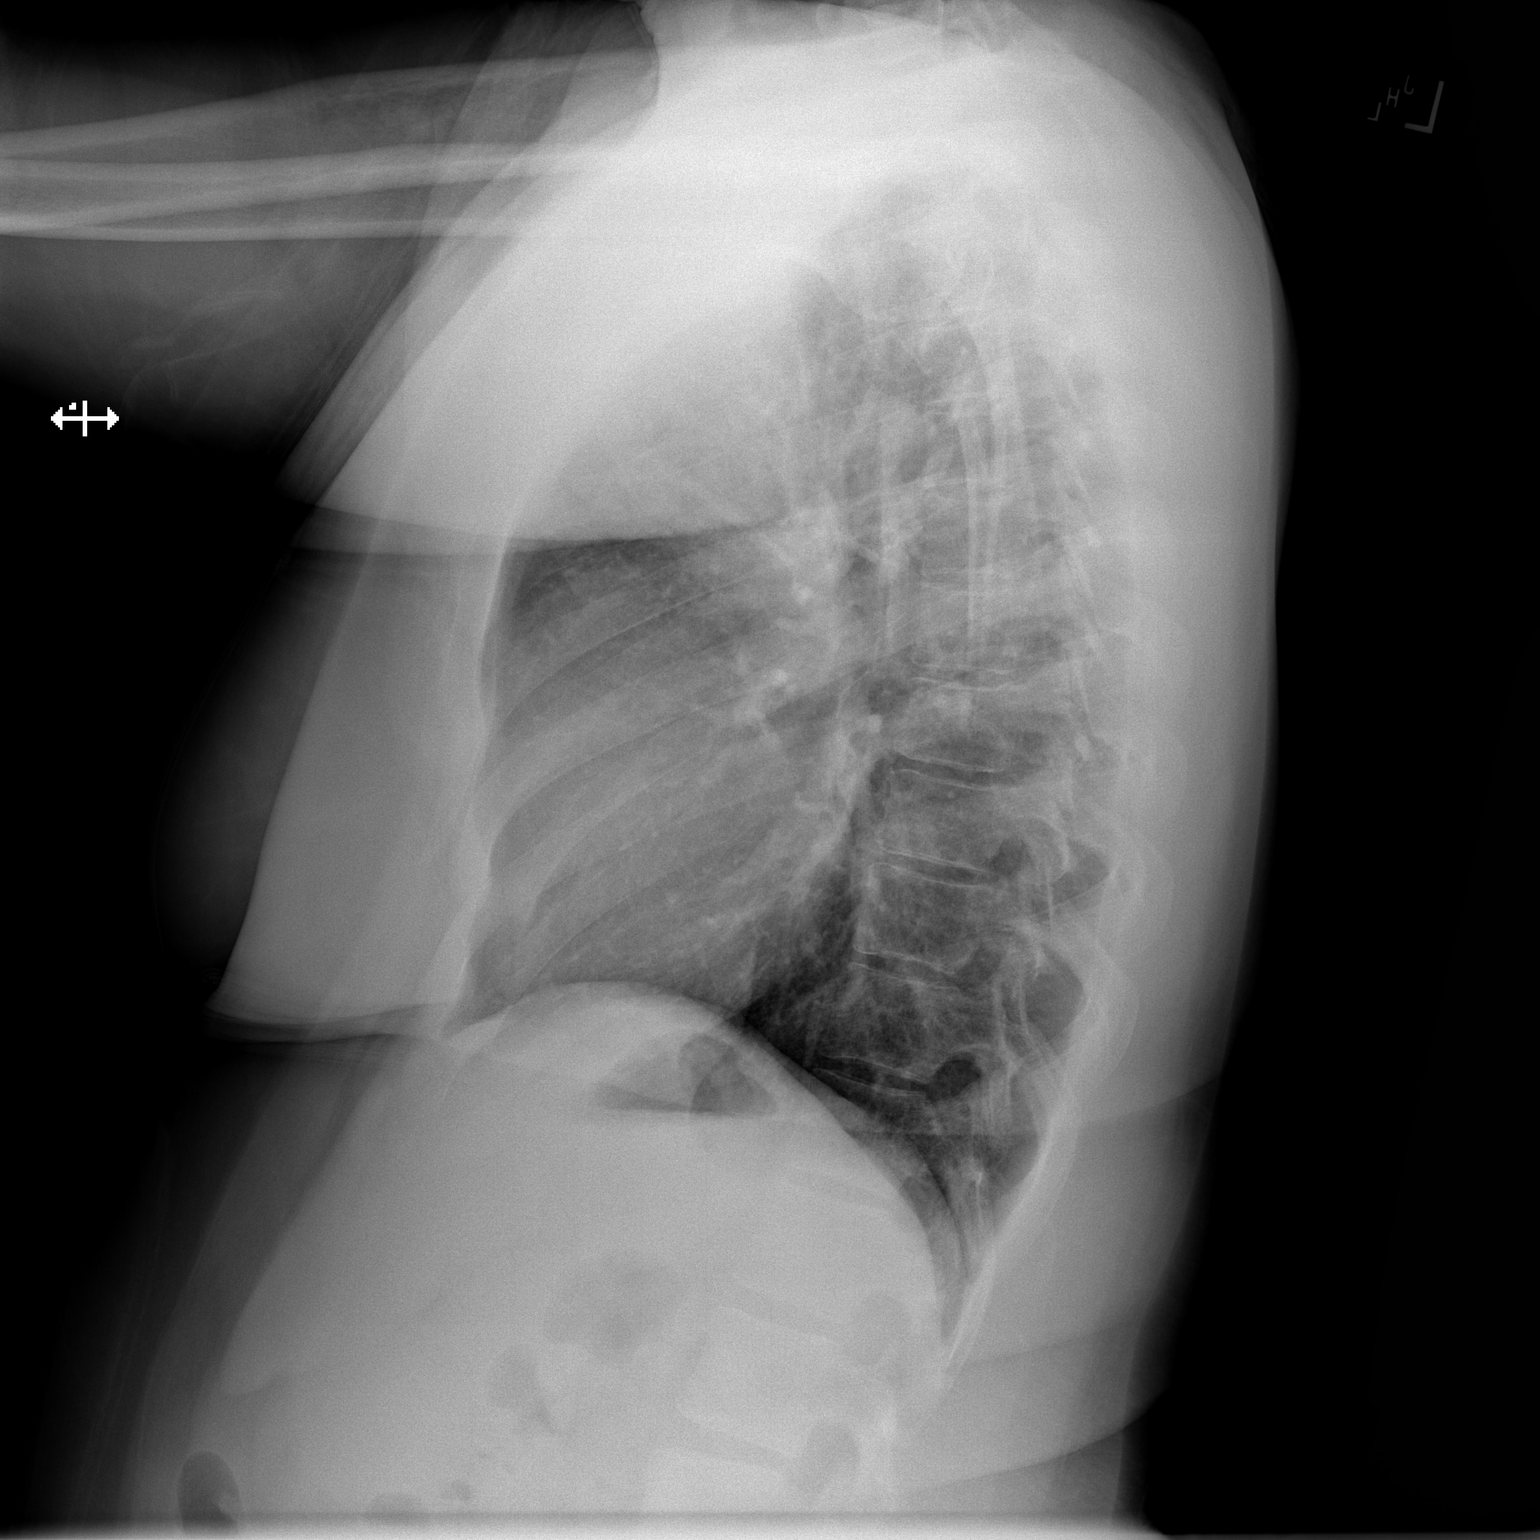

[2 of 2 positions shown; findings below may reference images not displayed]

FINDINGS: The heart size and mediastinal contours are within normal limits.
Both lungs are clear. The visualized skeletal structures are
unremarkable.
IMPRESSION: No active cardiopulmonary disease.

## 2022-12-26 ENCOUNTER — Other Ambulatory Visit: Payer: Self-pay

## 2022-12-26 ENCOUNTER — Encounter (HOSPITAL_BASED_OUTPATIENT_CLINIC_OR_DEPARTMENT_OTHER): Payer: Self-pay

## 2022-12-26 ENCOUNTER — Emergency Department (HOSPITAL_BASED_OUTPATIENT_CLINIC_OR_DEPARTMENT_OTHER)
Admission: EM | Admit: 2022-12-26 | Discharge: 2022-12-27 | Disposition: A | Discharge status: Home / Self Care | Payer: Managed Care, Other (non HMO) | Attending: Emergency Medicine | Admitting: Emergency Medicine

## 2022-12-26 ENCOUNTER — Emergency Department (HOSPITAL_BASED_OUTPATIENT_CLINIC_OR_DEPARTMENT_OTHER): Payer: Managed Care, Other (non HMO)

## 2022-12-26 DIAGNOSIS — R519 Headache, unspecified: Secondary | ICD-10-CM | POA: Insufficient documentation

## 2022-12-26 DIAGNOSIS — Z1152 Encounter for screening for COVID-19: Secondary | ICD-10-CM | POA: Insufficient documentation

## 2022-12-26 DIAGNOSIS — G8929 Other chronic pain: Secondary | ICD-10-CM | POA: Insufficient documentation

## 2022-12-26 DIAGNOSIS — Z91010 Allergy to peanuts: Secondary | ICD-10-CM | POA: Insufficient documentation

## 2022-12-26 LAB — RESP PANEL BY RT-PCR (RSV, FLU A&B, COVID)  RVPGX2
Influenza A by PCR: NEGATIVE
Influenza B by PCR: NEGATIVE
Resp Syncytial Virus by PCR: NEGATIVE
SARS Coronavirus 2 by RT PCR: NEGATIVE

## 2022-12-26 MED ORDER — DIPHENHYDRAMINE HCL 25 MG PO TABS: 25.0000 mg | ORAL_TABLET | Freq: Four times a day (QID) | ORAL | 0 refills | Status: AC | PRN

## 2022-12-26 MED ORDER — METOCLOPRAMIDE HCL 10 MG PO TABS: 10.0000 mg | ORAL_TABLET | Freq: Four times a day (QID) | ORAL | 0 refills | Status: AC | PRN

## 2022-12-26 MED ORDER — IBUPROFEN 400 MG PO TABS: 400.0000 mg | ORAL_TABLET | Freq: Four times a day (QID) | ORAL | 0 refills | Status: DC | PRN

## 2022-12-26 NOTE — ED Provider Notes (Addendum)
[facility] [place] EMERGENCY DEPT Provider Note   CSN: [id number] Arrival date & time: 12/26/22  2003     History  Chief Complaint  Patient presents with   Headache    Kristen Ramirez is a 42 y.o. female with past medical history migraine headaches and vitamin D deficiency who presents to the ED complaining of a "pressure" like pain all over her head that has been constant for the last 2 to 3 months.  Patient states that the pain occurs in various spots but has been relentless since onset 2 to 3 months ago.  Patient states that she has been evaluated for this pain by her primary care physician in [place] as well as at an urgent care.  She states her PCP recommended antihypertensive treatment as her blood pressure was in the 180s over 110s on PCP evaluation.  Patient states that she started this antihypertensive regimen, however, did not have resolution of her headaches and thus made the decision that she was going to discontinue the medication and did not want to see this particular PCP again.  Patient presented to an urgent care 3 days ago for evaluation of the same headaches where she was prescribed 5-day course of Augmentin to treat sinusitis. She is on day 3 of these antibiotics. Patient does state that she has a history of recurrent sinusitis.  She also has a history of migraine headaches and was previously on Topamax daily as a preventative but did not like the side effects of this medication and discontinue it as well after approximately a month of taking it.  Patient expresses extreme frustration regarding persistent symptoms for the last 2 to 3 months without any definitive diagnosis stating "I'm sick of people guessing. I need to know what is wrong with me."  Discussed possibility of treating patient's headache in the ED with a headache cocktail and patient states "that shit doesn't work, I don't want that, you need to scan my head." Pt denies fever, chills, nausea, vomiting,  chest pain, shortness of breath, abdominal pain, visual changes, photophobia, focal weakness, paresthesias, or any other complaints. She denies fall or trauma to head.     Home Medications Prior to Admission medications   Medication Sig Start Date End Date Taking? Authorizing Provider  diphenhydrAMINE (BENADRYL) 25 MG tablet Take 1 tablet (25 mg total) by mouth every 6 (six) hours as needed for up to 5 days (headache). 12/26/22 12/31/22 Yes Chaynce Schafer L, PA-C  ibuprofen (ADVIL) 400 MG tablet Take 1 tablet (400 mg total) by mouth every 6 (six) hours as needed for headache, mild pain or moderate pain. 12/26/22  Yes Witney Huie L, PA-C  metoCLOPramide (REGLAN) 10 MG tablet Take 1 tablet (10 mg total) by mouth every 6 (six) hours as needed for up to 5 days for nausea or vomiting (headache). 12/26/22 12/31/22 Yes Detta Mellin L, PA-C  Lidocaine (HM LIDOCAINE PATCH) 4 % PTCH Apply 1 patch topically every 12 (twelve) hours as needed. 02/04/22   Haskel Schroeder, PA-C  Multiple Vitamin (MULTIVITAMIN WITH MINERALS) TABS tablet Take 1 tablet by mouth daily.    [provider]  omeprazole (PRILOSEC) 20 MG capsule Take 20 mg by mouth daily.    [provider]  famotidine (PEPCID) 20 MG tablet Take 1 tablet (20 mg total) by mouth 2 (two) times daily. Patient not taking: Reported on 12/25/2020 03/21/20 12/25/20  Michela Pitcher A, PA-C  sucralfate (CARAFATE) 1 g tablet Take 1 tablet (1 g total)  by mouth 4 (four) times daily -  with meals and at bedtime. Patient not taking: Reported on 12/25/2020 03/21/20 12/25/20  Rodell Perna A, PA-C      Allergies    Oxycodone, Peanut-containing drug products, Codeine, Hydrocodone, and Peanut butter flavor    Review of Systems   Review of Systems  Constitutional:  Negative for activity change, appetite change, chills and fever.  HENT:  Negative for congestion, ear pain, rhinorrhea, sore throat and trouble swallowing.   Eyes:  Negative for photophobia, pain and  visual disturbance.  Respiratory:  Negative for cough and shortness of breath.   Cardiovascular:  Negative for chest pain and palpitations.  Gastrointestinal:  Negative for abdominal pain, diarrhea, nausea and vomiting.  Genitourinary:  Negative for dysuria and hematuria.  Musculoskeletal:  Negative for arthralgias, back pain, gait problem, neck pain and neck stiffness.  Skin:  Negative for color change and rash.  Neurological:  Positive for headaches. Negative for dizziness, seizures, syncope, facial asymmetry, speech difficulty, weakness, light-headedness and numbness.  Psychiatric/Behavioral:  Negative for confusion.   All other systems reviewed and are negative.   Physical Exam Updated Vital Signs BP 120/78 (BP Location: Right Arm)   Pulse 82   Temp 98 F (36.7 C) (Oral)   Resp 16   Ht 5\' 6"  (1.676 m)   Wt 91.6 kg   SpO2 100%   BMI 32.60 kg/m  Physical Exam Vitals and nursing note reviewed.  Constitutional:      General: She is not in acute distress.    Appearance: Normal appearance. She is not ill-appearing or toxic-appearing.  HENT:     Head: Normocephalic and atraumatic.     Right Ear: Tympanic membrane, ear canal and external ear normal.     Left Ear: Tympanic membrane, ear canal and external ear normal.     Mouth/Throat:     Mouth: Mucous membranes are moist.     Pharynx: Oropharynx is clear.     Comments: No erythema or exudate to posterior pharynx Eyes:     Extraocular Movements: Extraocular movements intact.     Conjunctiva/sclera: Conjunctivae normal.     Pupils: Pupils are equal, round, and reactive to light.  Cardiovascular:     Rate and Rhythm: Normal rate and regular rhythm.     Heart sounds: No murmur heard. Pulmonary:     Effort: Pulmonary effort is normal. No respiratory distress.     Breath sounds: Normal breath sounds. No wheezing, rhonchi or rales.  Abdominal:     General: Abdomen is flat.     Palpations: Abdomen is soft.     Tenderness: There  is no abdominal tenderness. There is no guarding.  Musculoskeletal:        General: Normal range of motion.     Cervical back: Neck supple.     Right lower leg: No edema.     Left lower leg: No edema.  Skin:    General: Skin is warm and dry.     Capillary Refill: Capillary refill takes less than 2 seconds.  Neurological:     Mental Status: She is alert.     Cranial Nerves: No cranial nerve deficit, dysarthria or facial asymmetry.     Sensory: No sensory deficit.     Motor: No weakness.     Comments: Answering questions appropriately  Psychiatric:        Mood and Affect: Mood normal.        Behavior: Behavior normal.  ED Results / Procedures / Treatments   Labs (all labs ordered are listed, but only abnormal results are displayed) Labs Reviewed  RESP PANEL BY RT-PCR (RSV, FLU A&B, COVID)  [id number]    EKG None  Radiology CT Maxillofacial Wo Contrast  Result Date: 12/26/2022 CLINICAL DATA:  Sinusitis. EXAM: CT MAXILLOFACIAL WITHOUT CONTRAST TECHNIQUE: Multidetector CT imaging of the maxillofacial structures was performed. Multiplanar CT image reconstructions were also generated. RADIATION DOSE REDUCTION: This exam was performed according to the departmental dose-optimization program which includes automated exposure control, adjustment of the mA and/or kV according to patient size and/or use of iterative reconstruction technique. COMPARISON:  None Available. FINDINGS: Osseous: No fracture or mandibular dislocation. No destructive process. Orbits: Negative. No traumatic or inflammatory finding. Sinuses: Clear. Soft tissues: Negative. Limited intracranial: No significant or unexpected finding. IMPRESSION: No significant sinus disease. Electronically Signed   By: Darliss Cheney M.D.   On: 12/26/2022 22:13   CT Head Wo Contrast  Result Date: 12/26/2022 CLINICAL DATA:  Headache. EXAM: CT HEAD WITHOUT CONTRAST TECHNIQUE: Contiguous axial images were obtained from the base of the skull  through the vertex without intravenous contrast. RADIATION DOSE REDUCTION: This exam was performed according to the departmental dose-optimization program which includes automated exposure control, adjustment of the mA and/or kV according to patient size and/or use of iterative reconstruction technique. COMPARISON:  CT head 12/09/2020 FINDINGS: Brain: No evidence of acute infarction, hemorrhage, hydrocephalus, extra-axial collection or mass lesion/mass effect. Vascular: No hyperdense vessel or unexpected calcification. Skull: Normal. Negative for fracture or focal lesion. Sinuses/Orbits: No acute finding. Other: None. IMPRESSION: No acute intracranial pathology. Electronically Signed   By: Darliss Cheney M.D.   On: 12/26/2022 22:06    Procedures Procedures   Medications Ordered in ED Medications - No data to display  ED Course/ Medical Decision Making/ A&P                           Medical Decision Making Amount and/or Complexity of Data Reviewed Labs: ordered. Decision-making details documented in ED Course. Radiology: ordered. Decision-making details documented in ED Course.  Risk OTC drugs. Prescription drug management.   This is a 42 year old female with a past medical history of migraine headaches who presents to the ED complaining of continued chronic headache pain described as a "pressure" all over her head that has been persistent for the past 2 to 3 months.  Patient has been evaluated by primary care, and another emergency department, at urgent care, and in the outpatient setting by neurology for these headaches in the past.  She experienced no relief with antihypertensives recommended by her primary care, Augmentin prescribed at urgent care to treat sinusitis, Topamax to be used as a preventative migraine medication, or other over-the-counter medications to help with pain.  She states that she was prescribed Nurtec to take for her migraine headaches, however, "is sick of people guessing  what is wrong with her" and has opted not to take the medication to see if it would help.  On my exam today, patient is neurologically intact and also without any sinus tenderness, normal ear exam, normal heart and lung exam, and no other exam findings to explain her headaches.  She appears well-hydrated.  Other than the headaches, she is having no other symptoms and has no associated nausea, vomiting, photophobia, or other symptoms typical of migraine headaches. I offered various medications to treat her headache while in the emergency department patient repeatedly  declines these options.  She insisted on further imaging to rule out identifiable cause of her headaches. With persistent headaches and pt expressing unwillingness to return to previous outpatient care providers for re-evaluation, I did order this imaging to rule out any intracranial pathology. CT of the brain did not show any acute abnormalities. I also scanned her sinuses which showed no significant sinus disease. Vital signs have remained stable including no significantly elevated blood pressure. Viral swabs were negative. Discussed case with attending MD, Dr. Donnald Garre, who also evaluated pt and agrees with management and that pt is safe to be discharged home. Will provide her with Reglan, Benadryl, and ibuprofen to take up to every 6 hours to manage her headaches and provide follow up outpatient with [facility] for her chronic headaches. Pt strongly encouraged to see neurology for better management of her condition. Also provided with alternative primary care clinics she may follow up with should she choose to do so. Recommended she stop Augmentin as there are no signs of sinusitis on exam or CT scan. Given strict return precautions. Stable for discharge.    Despite offering headache control multiple times throughout ED stay and pt declining, nurse went to discharge pt and she was requesting treatment for her headache. Ordered single dose  of medication regimen pt is to be discharged with prior to her departure from ED.      Final Clinical Impression(s) / ED Diagnoses Final diagnoses:  Chronic headache with normal neurologic examination    Rx / DC Orders ED Discharge Orders          Ordered    metoCLOPramide (REGLAN) 10 MG tablet  Every 6 hours PRN        12/26/22 2341    diphenhydrAMINE (BENADRYL) 25 MG tablet  Every 6 hours PRN        12/26/22 2341    ibuprofen (ADVIL) 400 MG tablet  Every 6 hours PRN        12/26/22 2341              Tonette Lederer, PA-C 12/26/22 2353    Tonette Lederer, PA-C 12/27/22 0018    Arby Barrette, MD 12/30/22 [phone]

## 2022-12-26 NOTE — ED Provider Notes (Signed)
I provided a substantive portion of the care of this patient.  I personally performed the entirety of the medical decision making for this encounter. {Remember to document shared critical care using "edcritical" dot phrase:1}    

## 2022-12-26 NOTE — Discharge Instructions (Addendum)
Thank you for letting us take care of you today.  As discussed, your imaging of your head was reassuring and we did not find any explanation for your headaches on the CT scan. We also scanned your sinuses. There was no significant infection in the sinuses so I recommend you stop taking the Augmentin previously prescribed.  I am sending 3 medications to your pharmacy that you can take at home to help with your headaches. Please take them together up to every 6 hours to manage your headaches. I am also recommending that you follow up in the office with [facility]. They may want to do further testing to find a cause for your headaches or start you on regular medication to better manage them. I have provided this contact information in your paper work.  Should you develop fever, uncontrolled vomiting, chest pain, shortness of breath, weakness on one side of your body, or other concerns, please be re-evaluated at nearest emergency department.  Should you choose to see a new primary care provider, I have also provided the names of 2 local clinics you may follow up with or you may go to a clinic of your own choosing.

## 2022-12-26 NOTE — ED Triage Notes (Addendum)
Pt c/o "head pressure" all over, states it moves. Pt c/o congestion x 1 month. Pt states when she wakes up her head feels heavy. Pt states she saw PCP for same and was given BP meds which she feels is not helping. Pt states she went to UC a few days ago and was given nasal spray and amox-clav for sinus infection.

## 2022-12-27 MED ORDER — METOCLOPRAMIDE HCL 10 MG PO TABS
10.0000 mg | ORAL_TABLET | Freq: Once | ORAL | Status: AC
  Administered 2022-12-27: 10 mg via ORAL
  Filled 2022-12-27: qty 1

## 2022-12-27 MED ORDER — DIPHENHYDRAMINE HCL 25 MG PO CAPS
25.0000 mg | ORAL_CAPSULE | Freq: Once | ORAL | Status: AC
  Administered 2022-12-27: 25 mg via ORAL
  Filled 2022-12-27: qty 1

## 2022-12-27 MED ORDER — IBUPROFEN 400 MG PO TABS
400.0000 mg | ORAL_TABLET | Freq: Once | ORAL | Status: AC
  Administered 2022-12-27: 400 mg via ORAL
  Filled 2022-12-27: qty 1

## 2023-02-25 ENCOUNTER — Ambulatory Visit: Payer: Managed Care, Other (non HMO) | Admitting: Psychiatry

## 2023-05-29 ENCOUNTER — Other Ambulatory Visit: Payer: Self-pay

## 2023-05-29 ENCOUNTER — Emergency Department (HOSPITAL_BASED_OUTPATIENT_CLINIC_OR_DEPARTMENT_OTHER): Payer: Managed Care, Other (non HMO) | Admitting: Radiology

## 2023-05-29 ENCOUNTER — Encounter (HOSPITAL_BASED_OUTPATIENT_CLINIC_OR_DEPARTMENT_OTHER): Payer: Self-pay | Admitting: Emergency Medicine

## 2023-05-29 ENCOUNTER — Emergency Department (HOSPITAL_BASED_OUTPATIENT_CLINIC_OR_DEPARTMENT_OTHER)
Admission: EM | Admit: 2023-05-29 | Discharge: 2023-05-29 | Disposition: A | Discharge status: Home / Self Care | Payer: Managed Care, Other (non HMO) | Attending: Emergency Medicine | Admitting: Emergency Medicine

## 2023-05-29 DIAGNOSIS — R0789 Other chest pain: Secondary | ICD-10-CM | POA: Diagnosis present

## 2023-05-29 DIAGNOSIS — Z91010 Allergy to peanuts: Secondary | ICD-10-CM | POA: Insufficient documentation

## 2023-05-29 LAB — CBC WITH DIFFERENTIAL/PLATELET
Abs Immature Granulocytes: 0.02 10*3/uL (ref 0.00–0.07)
Basophils Absolute: 0.1 10*3/uL (ref 0.0–0.1)
Basophils Relative: 1 %
Eosinophils Absolute: 0.3 10*3/uL (ref 0.0–0.5)
Eosinophils Relative: 4 %
HCT: 37.3 % (ref 36.0–46.0)
Hemoglobin: 12.2 g/dL (ref 12.0–15.0)
Immature Granulocytes: 0 %
Lymphocytes Relative: 40 %
Lymphs Abs: 3.9 10*3/uL (ref 0.7–4.0)
MCH: 26.6 pg (ref 26.0–34.0)
MCHC: 32.7 g/dL (ref 30.0–36.0)
MCV: 81.4 fL (ref 80.0–100.0)
Monocytes Absolute: 0.9 10*3/uL (ref 0.1–1.0)
Monocytes Relative: 9 %
Neutro Abs: 4.4 10*3/uL (ref 1.7–7.7)
Neutrophils Relative %: 46 %
Platelets: 312 10*3/uL (ref 150–400)
RBC: 4.58 MIL/uL (ref 3.87–5.11)
RDW: 15.7 % — ABNORMAL HIGH (ref 11.5–15.5)
WBC: 9.6 10*3/uL (ref 4.0–10.5)
nRBC: 0 % (ref 0.0–0.2)

## 2023-05-29 LAB — BASIC METABOLIC PANEL
Anion gap: 11 (ref 5–15)
BUN: 12 mg/dL (ref 6–20)
CO2: 25 mmol/L (ref 22–32)
Calcium: 9.4 mg/dL (ref 8.9–10.3)
Chloride: 105 mmol/L (ref 98–111)
Creatinine, Ser: 0.74 mg/dL (ref 0.44–1.00)
GFR, Estimated: >60 mL/min (ref >60)
Glucose, Bld: 109 mg/dL — ABNORMAL HIGH (ref 70–99)
Potassium: 3.5 mmol/L (ref 3.5–5.1)
Sodium: 141 mmol/L (ref 135–145)

## 2023-05-29 LAB — TROPONIN I (HIGH SENSITIVITY): Troponin I (High Sensitivity): <2 ng/L (ref <18)

## 2023-05-29 MED ORDER — IBUPROFEN 800 MG PO TABS: 800.0000 mg | ORAL_TABLET | Freq: Three times a day (TID) | ORAL | 0 refills | Status: AC | PRN

## 2023-05-29 MED ORDER — KETOROLAC TROMETHAMINE 30 MG/ML IJ SOLN
15.0000 mg | Freq: Once | INTRAMUSCULAR | Status: AC
  Administered 2023-05-29: 15 mg via INTRAVENOUS
  Filled 2023-05-29: qty 1

## 2023-05-29 NOTE — ED Provider Notes (Signed)
[place] EMERGENCY DEPARTMENT AT [facility] Provider Note   CSN: [id number] Arrival date & time: 05/29/23  0309     History  Chief Complaint  Patient presents with   Chest Pain    Kristen Ramirez is a 42 y.o. female.  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  Was pulling down a rollup door but does not not know if that caused any kind of injury.  Denies any heavy lifting.  Pain is to her left lateral chest wall without radiation.  Has been constant since about 9:00.  Worse with palpation and movement of the arm.  No associated shortness of breath, cough, fever, nausea, vomiting, sweating.  Denies any cardiac history.  She is never had this kind of pain before.  No focal weakness, numbness or tingling.  No history of blood clots.  No history of hormone use.  No abdominal pain or back pain.  No fever or cough.  No history of similar pain in the past.  The history is provided by the patient.  Chest Pain Associated symptoms: no abdominal pain, no dizziness, no fever, no headache, no nausea, no shortness of breath, no vomiting and no weakness        Home Medications Prior to Admission medications   Medication Sig Start Date End Date Taking? Authorizing Provider  diphenhydrAMINE (BENADRYL) 25 MG tablet Take 1 tablet (25 mg total) by mouth every 6 (six) hours as needed for up to 5 days (headache). 12/26/22 12/31/22  Gowens, Mariah L, PA-C  ibuprofen (ADVIL) 400 MG tablet Take 1 tablet (400 mg total) by mouth every 6 (six) hours as needed for headache, mild pain or moderate pain. 12/26/22   Gowens, Mariah L, PA-C  Lidocaine (HM LIDOCAINE PATCH) 4 % PTCH Apply 1 patch topically every 12 (twelve) hours as needed. 02/04/22   Haskel Schroeder, PA-C  metoCLOPramide (REGLAN) 10 MG tablet Take 1 tablet (10 mg total) by mouth every 6 (six) hours as needed for up to 5 days for nausea or vomiting (headache). 12/26/22 12/31/22  Gowens,  Lawrence Marseilles, PA-C  Multiple Vitamin (MULTIVITAMIN WITH MINERALS) TABS tablet Take 1 tablet by mouth daily.    [provider]  omeprazole (PRILOSEC) 20 MG capsule Take 20 mg by mouth daily.    [provider]  famotidine (PEPCID) 20 MG tablet Take 1 tablet (20 mg total) by mouth 2 (two) times daily. Patient not taking: Reported on 12/25/2020 03/21/20 12/25/20  Michela Pitcher A, PA-C  sucralfate (CARAFATE) 1 g tablet Take 1 tablet (1 g total) by mouth 4 (four) times daily -  with meals and at bedtime. Patient not taking: Reported on 12/25/2020 03/21/20 12/25/20  Michela Pitcher A, PA-C      Allergies    Oxycodone, Peanut-containing drug products, Codeine, Hydrocodone, and Peanut butter flavor    Review of Systems   Review of Systems  Constitutional:  Negative for activity change, appetite change and fever.  HENT:  Negative for congestion and rhinorrhea.   Respiratory:  Positive for chest tightness. Negative for shortness of breath.   Cardiovascular:  Positive for chest pain.  Gastrointestinal:  Negative for abdominal pain, nausea and vomiting.  Genitourinary:  Negative for dysuria and hematuria.  Musculoskeletal:  Negative for arthralgias and myalgias.  Skin:  Negative for rash.  Neurological:  Negative for dizziness, weakness and headaches.   all other systems are negative except as noted in the HPI  and PMH.    Physical Exam Updated Vital Signs BP 119/79   Pulse 63   Temp 98.1 F (36.7 C) (Oral)   Resp 15   Ht 5\' 6"  (1.676 m)   Wt 91.6 kg   SpO2 97%   BMI 32.60 kg/m  Physical Exam Vitals and nursing note reviewed.  Constitutional:      General: She is not in acute distress.    Appearance: She is well-developed.  HENT:     Head: Normocephalic and atraumatic.     Mouth/Throat:     Pharynx: No oropharyngeal exudate.  Eyes:     Conjunctiva/sclera: Conjunctivae normal.     Pupils: Pupils are equal, round, and reactive to light.  Neck:     Comments: No  meningismus. Cardiovascular:     Rate and Rhythm: Normal rate and regular rhythm.     Heart sounds: Normal heart sounds. No murmur heard. Pulmonary:     Effort: Pulmonary effort is normal. No respiratory distress.     Breath sounds: Normal breath sounds.     Comments: Chaperone present Youth worker.  There is tenderness to palpation of left lateral sternal border. Chest:     Chest wall: Tenderness present.  Abdominal:     Palpations: Abdomen is soft.     Tenderness: There is no abdominal tenderness. There is no guarding or rebound.  Musculoskeletal:        General: No tenderness. Normal range of motion.     Cervical back: Normal range of motion and neck supple.  Skin:    General: Skin is warm.  Neurological:     Mental Status: She is alert and oriented to person, place, and time.     Cranial Nerves: No cranial nerve deficit.     Motor: No abnormal muscle tone.     Coordination: Coordination normal.     Comments: No ataxia on finger to nose bilaterally. No pronator drift. 5/5 strength throughout. CN 2-12 intact.Equal grip strength. Sensation intact.   Psychiatric:        Behavior: Behavior normal.     ED Results / Procedures / Treatments   Labs (all labs ordered are listed, but only abnormal results are displayed) Labs Reviewed  CBC WITH DIFFERENTIAL/PLATELET - Abnormal; Notable for the following components:      Result Value   RDW 15.7 (*)    All other components within normal limits  BASIC METABOLIC PANEL - Abnormal; Notable for the following components:   Glucose, Bld 109 (*)    All other components within normal limits  TROPONIN I (HIGH SENSITIVITY)    EKG EKG Interpretation  Date/Time:  Saturday May 29 2023 03:21:00 EDT Ventricular Rate:  77 PR Interval:  159 QRS Duration: 80 QT Interval:  367 QTC Calculation: 416 R Axis:   33 Text Interpretation: Sinus rhythm Probable left atrial enlargement No significant change was found Confirmed by Glynn Octave [phone])  on 05/29/2023 3:53:25 AM  Radiology DG Chest 2 View  Result Date: 05/29/2023 CLINICAL DATA:  Chest pain. EXAM: CHEST - 2 VIEW COMPARISON:  03/08/2022. FINDINGS: The heart size and mediastinal contours are within normal limits. Both lungs are clear. No acute osseous abnormality. IMPRESSION: No active cardiopulmonary disease. Electronically Signed   By: Thornell Sartorius M.D.   On: 05/29/2023 04:36    Procedures Procedures    Medications Ordered in ED Medications - No data to display  ED Course/ Medical Decision Making/ A&P  Medical Decision Making Amount and/or Complexity of Data Reviewed Labs: ordered. Decision-making details documented in ED Course. Radiology: ordered and independent interpretation performed. Decision-making details documented in ED Course. ECG/medicine tests: ordered and independent interpretation performed. Decision-making details documented in ED Course.  Risk Prescription drug management.  Left-sided chest pain since 9 PM.  Worse with palpation or movement.  EKG is sinus rhythm without acute ST changes.  Does have T wave inversion lead III which is chronic and unchanged.  Suspect musculoskeletal chest pain.  Will check screening labs and chest x-ray.  Low suspicion for ACS or PE PERC negative.   Troponin negative.  As pain has been ongoing since 9 PM, single troponin is adequate to rule out ACS.  Chest x-ray negative for pneumothorax or pneumonia.  Results reviewed and interpreted by me.  Will treat for musculoskeletal chest pain with anti-inflammatories.  Follow-up with PCP.  Return to the ED with exertional chest pain, pain associate with shortness of breath, nausea, vomiting, sweating or other concerns.       Final Clinical Impression(s) / ED Diagnoses Final diagnoses:  Atypical chest pain    Rx / DC Orders ED Discharge Orders     None         Melessia Kaus, Jeannett Senior, MD 05/29/23 [phone]

## 2023-05-29 NOTE — Discharge Instructions (Addendum)
Testing is reassuring.  No evidence of heart attack or blood clot in the lung.  We suspect your pain is coming from your chest wall.  Take the anti-inflammatories as prescribed and follow-up with your doctor.  Return to the ED with exertional chest pain, pain associated with shortness of breath, nausea, vomiting, sweating or other concerns.

## 2023-05-29 NOTE — ED Triage Notes (Signed)
Pt c/o intermittent centralized chest pain while walking at work.  Chest sore to touch. No associated s/s.

## 2023-10-20 ENCOUNTER — Emergency Department (HOSPITAL_BASED_OUTPATIENT_CLINIC_OR_DEPARTMENT_OTHER)
Admission: EM | Admit: 2023-10-20 | Discharge: 2023-10-20 | Disposition: A | Discharge status: Home / Self Care | Payer: Managed Care, Other (non HMO) | Attending: Emergency Medicine | Admitting: Emergency Medicine

## 2023-10-20 ENCOUNTER — Other Ambulatory Visit: Payer: Self-pay

## 2023-10-20 ENCOUNTER — Emergency Department (HOSPITAL_BASED_OUTPATIENT_CLINIC_OR_DEPARTMENT_OTHER): Payer: Managed Care, Other (non HMO)

## 2023-10-20 ENCOUNTER — Encounter (HOSPITAL_BASED_OUTPATIENT_CLINIC_OR_DEPARTMENT_OTHER): Payer: Self-pay

## 2023-10-20 DIAGNOSIS — R1030 Lower abdominal pain, unspecified: Secondary | ICD-10-CM | POA: Insufficient documentation

## 2023-10-20 DIAGNOSIS — M549 Dorsalgia, unspecified: Secondary | ICD-10-CM | POA: Diagnosis present

## 2023-10-20 DIAGNOSIS — Z91010 Allergy to peanuts: Secondary | ICD-10-CM | POA: Diagnosis not present

## 2023-10-20 DIAGNOSIS — M545 Low back pain, unspecified: Secondary | ICD-10-CM | POA: Insufficient documentation

## 2023-10-20 LAB — URINALYSIS, W/ REFLEX TO CULTURE (INFECTION SUSPECTED)
Bacteria, UA: NONE SEEN
Bilirubin Urine: NEGATIVE
Glucose, UA: NEGATIVE mg/dL
Hgb urine dipstick: NEGATIVE
Ketones, ur: NEGATIVE mg/dL
Leukocytes,Ua: NEGATIVE
Nitrite: NEGATIVE
Protein, ur: NEGATIVE mg/dL
Specific Gravity, Urine: 1.024 (ref 1.005–1.030)
pH: 6 (ref 5.0–8.0)

## 2023-10-20 LAB — CBC WITH DIFFERENTIAL/PLATELET
Abs Immature Granulocytes: 0.02 10*3/uL (ref 0.00–0.07)
Basophils Absolute: 0.1 10*3/uL (ref 0.0–0.1)
Basophils Relative: 1 %
Eosinophils Absolute: 0.2 10*3/uL (ref 0.0–0.5)
Eosinophils Relative: 2 %
HCT: 41.5 % (ref 36.0–46.0)
Hemoglobin: 13.5 g/dL (ref 12.0–15.0)
Immature Granulocytes: 0 %
Lymphocytes Relative: 36 %
Lymphs Abs: 3 10*3/uL (ref 0.7–4.0)
MCH: 26.2 pg (ref 26.0–34.0)
MCHC: 32.5 g/dL (ref 30.0–36.0)
MCV: 80.6 fL (ref 80.0–100.0)
Monocytes Absolute: 0.8 10*3/uL (ref 0.1–1.0)
Monocytes Relative: 9 %
Neutro Abs: 4.2 10*3/uL (ref 1.7–7.7)
Neutrophils Relative %: 52 %
Platelets: 319 10*3/uL (ref 150–400)
RBC: 5.15 MIL/uL — ABNORMAL HIGH (ref 3.87–5.11)
RDW: 16.1 % — ABNORMAL HIGH (ref 11.5–15.5)
WBC: 8.2 10*3/uL (ref 4.0–10.5)
nRBC: 0 % (ref 0.0–0.2)

## 2023-10-20 LAB — COMPREHENSIVE METABOLIC PANEL
ALT: 9 U/L (ref 0–44)
AST: 12 U/L — ABNORMAL LOW (ref 15–41)
Albumin: 4.5 g/dL (ref 3.5–5.0)
Alkaline Phosphatase: 95 U/L (ref 38–126)
Anion gap: 8 (ref 5–15)
BUN: 15 mg/dL (ref 6–20)
CO2: 29 mmol/L (ref 22–32)
Calcium: 10.1 mg/dL (ref 8.9–10.3)
Chloride: 103 mmol/L (ref 98–111)
Creatinine, Ser: 0.84 mg/dL (ref 0.44–1.00)
GFR, Estimated: >60 mL/min (ref >60)
Glucose, Bld: 98 mg/dL (ref 70–99)
Potassium: 4.1 mmol/L (ref 3.5–5.1)
Sodium: 140 mmol/L (ref 135–145)
Total Bilirubin: 0.3 mg/dL (ref 0.3–1.2)
Total Protein: 7.3 g/dL (ref 6.5–8.1)

## 2023-10-20 MED ORDER — KETOROLAC TROMETHAMINE 15 MG/ML IJ SOLN
15.0000 mg | Freq: Once | INTRAMUSCULAR | Status: AC
  Administered 2023-10-20: 15 mg via INTRAVENOUS
  Filled 2023-10-20: qty 1

## 2023-10-20 MED ORDER — METHYLPREDNISOLONE 4 MG PO TBPK: ORAL_TABLET | ORAL | 0 refills | Status: AC

## 2023-10-20 MED ORDER — IOHEXOL 300 MG/ML  SOLN
100.0000 mL | Freq: Once | INTRAMUSCULAR | Status: AC | PRN
  Administered 2023-10-20: 100 mL via INTRAVENOUS

## 2023-10-20 MED ORDER — ALBUTEROL SULFATE HFA 108 (90 BASE) MCG/ACT IN AERS: 2.0000 | INHALATION_SPRAY | RESPIRATORY_TRACT | Status: DC | PRN

## 2023-10-20 NOTE — Discharge Instructions (Signed)
All your blood work today looked normal.  It will be important to follow-up with your doctor to get your vitamin D levels checked but it would be okay to start back on that medication.  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  Otherwise your bones, intestines and organs were normal on the CAT scan.  The pain you are experiencing is most likely something musculoskeletal in the bones, muscles or joints.  We will try a steroid Dosepak to see if that decreases inflammation but it is okay for you to take the anti-inflammatory and muscle relaxer as needed.  Avoid any heavy lifting at work.

## 2023-10-20 NOTE — ED Triage Notes (Signed)
Pt POV from home c/o bilateral lower back pain radiating into buttocks intermittently x 2 weeks. Denies trauma.

## 2023-10-20 NOTE — ED Provider Notes (Signed)
[place] EMERGENCY DEPARTMENT AT [facility] Provider Note   CSN: [id number] Arrival date & time: 10/20/23  7829     History  Chief Complaint  Patient presents with   Back Pain    Kristen Ramirez is a 42 y.o. female.  Patient is a 42 year old female with a history of vitamin D deficiency, abdominal hysterectomy who is presenting today with complaints of back pain.  She reports the pain is now been present for about 2 weeks but cannot recall any specific cause of the pain.  It just started 1 day without any traumatic cause.  The pain is in the lower back but she also feels it in her pelvis and sometimes it radiates up her back.  It is not particularly worsened by any activity or position.  Sometimes she does feel it radiate into her left butt cheek but also up into her shoulder blades.  She has been having normal bowel movements but reported after 1 bowel movement it did seem to help the pain a little.  She denies any urinary symptoms or vaginal discharge.  She has not had fever cough or shortness of breath.  She has been compliant with her blood pressure medications.  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  She went to urgent care last week and was given ibuprofen and a muscle relaxer but reports it has not changed her discomfort.  She denies any numbness or weakness in her arms or legs.  No headaches.  The history is provided by the patient.  Back Pain      Home Medications Prior to Admission medications   Medication Sig Start Date End Date Taking? Authorizing Provider  methylPREDNISolone (MEDROL DOSEPAK) 4 MG TBPK tablet Take according to package instructions 10/20/23  Yes Gwyneth Sprout, MD  diphenhydrAMINE (BENADRYL) 25 MG tablet Take 1 tablet (25 mg  total) by mouth every 6 (six) hours as needed for up to 5 days (headache). 12/26/22 12/31/22  Gowens, Mariah L, PA-C  ibuprofen (ADVIL) 800 MG tablet Take 1 tablet (800 mg total) by mouth every 8 (eight) hours as needed for moderate pain. 05/29/23   Rancour, Jeannett Senior, MD  Lidocaine (HM LIDOCAINE PATCH) 4 % PTCH Apply 1 patch topically every 12 (twelve) hours as needed. 02/04/22   Haskel Schroeder, PA-C  metoCLOPramide (REGLAN) 10 MG tablet Take 1 tablet (10 mg total) by mouth every 6 (six) hours as needed for up to 5 days for nausea or vomiting (headache). 12/26/22 12/31/22  Gowens, Lawrence [place], PA-C  Multiple Vitamin (MULTIVITAMIN WITH MINERALS) TABS tablet Take 1 tablet by mouth daily.    [provider]  omeprazole (PRILOSEC) 20 MG capsule Take 20 mg by mouth daily.    [provider]  famotidine (PEPCID) 20 MG tablet Take 1 tablet (20 mg total) by mouth 2 (two) times daily. Patient not taking: Reported on 12/25/2020 03/21/20 12/25/20  Michela Pitcher A, PA-C  sucralfate (CARAFATE) 1 g tablet Take 1 tablet (1 g total) by mouth 4 (four) times daily -  with meals and at bedtime. Patient not taking: Reported on 12/25/2020 03/21/20 12/25/20  Michela Pitcher A, PA-C      Allergies    Oxycodone, Peanut-containing drug products, Codeine, Hydrocodone, and  Peanut butter flavor    Review of Systems   Review of Systems  Musculoskeletal:  Positive for back pain.    Physical Exam Updated Vital Signs BP (!) 141/87 (BP Location: Right Arm)   Pulse 78   Temp 98.3 F (36.8 C) (Oral)   Ht 5\' 6"  (1.676 m)   Wt 90.7 kg   SpO2 98%   BMI 32.28 kg/m  Physical Exam Vitals and nursing note reviewed.  Constitutional:      General: She is not in acute distress.    Appearance: She is well-developed.  HENT:     Head: Normocephalic and atraumatic.  Eyes:     Conjunctiva/sclera: Conjunctivae normal.     Pupils: Pupils are equal, round, and reactive to light.  Cardiovascular:     Rate and Rhythm: Normal rate and  regular rhythm.     Heart sounds: No murmur heard. Pulmonary:     Effort: Pulmonary effort is normal. No respiratory distress.     Breath sounds: Normal breath sounds. No wheezing or rales.  Abdominal:     General: There is no distension.     Palpations: Abdomen is soft.     Tenderness: There is abdominal tenderness in the suprapubic area. There is right CVA tenderness and left CVA tenderness. There is no guarding or rebound.  Musculoskeletal:        General: Tenderness present. Normal range of motion.     Cervical back: Normal range of motion and neck supple.     Lumbar back: Tenderness and bony tenderness present. Normal range of motion.     Right lower leg: No edema.     Left lower leg: No edema.     Comments: Tenderness in the paralumbar areas and over the spine but normal flexion and extension  Skin:    General: Skin is warm and dry.     Findings: No erythema or rash.  Neurological:     Mental Status: She is alert and oriented to person, place, and time. Mental status is at baseline.     Sensory: Sensation is intact.     Motor: Motor function is intact.     Gait: Gait is intact.  Psychiatric:        Behavior: Behavior normal.     ED Results / Procedures / Treatments   Labs (all labs ordered are listed, but only abnormal results are displayed) Labs Reviewed  CBC WITH DIFFERENTIAL/PLATELET - Abnormal; Notable for the following components:      Result Value   RBC 5.15 (*)    RDW 16.1 (*)    All other components within normal limits  COMPREHENSIVE METABOLIC PANEL - Abnormal; Notable for the following components:   AST 12 (*)    All other components within normal limits  URINALYSIS, W/ REFLEX TO CULTURE (INFECTION SUSPECTED)    EKG None  Radiology CT ABDOMEN PELVIS W CONTRAST  Result Date: 10/20/2023 CLINICAL DATA:  Abdominal and flank pain for 2 weeks. EXAM: CT ABDOMEN AND PELVIS WITH CONTRAST TECHNIQUE: Multidetector CT imaging of the abdomen and pelvis was  performed using the standard protocol following bolus administration of intravenous contrast. RADIATION DOSE REDUCTION: This exam was performed according to the departmental dose-optimization program which includes automated exposure control, adjustment of the mA and/or kV according to patient size and/or use of iterative reconstruction technique. CONTRAST:  OMNIPAQUE IOHEXOL 300 MG/ML  SOLN COMPARISON:  12/25/2020 FINDINGS: Lower Chest: No acute findings. Hepatobiliary: No suspicious hepatic masses identified. Gallbladder is  unremarkable. No evidence of biliary ductal dilatation. Pancreas:  No mass or inflammatory changes. Spleen: Within normal limits in size and appearance. Adrenals/Urinary Tract: Stable 1.3 cm left adrenal mass, consistent with benign adenoma (No followup imaging is recommended). No suspicious renal masses identified. No evidence of ureteral calculi or hydronephrosis. Stomach/Bowel: No evidence of obstruction, inflammatory process or abnormal fluid collections. Normal appendix visualized. Vascular/Lymphatic: No pathologically enlarged lymph nodes. No acute vascular findings. Reproductive: Prior hysterectomy noted. Adnexal regions are unremarkable in appearance. Other:  None. Musculoskeletal: No suspicious bone lesions identified. 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 IMPRESSION: No acute findings or other significant abnormality. Electronically Signed   By: Danae Orleans M.D.   On: 10/20/2023 09:58    Procedures Procedures    Medications Ordered in ED Medications  ketorolac (TORADOL) 15 MG/ML injection 15 mg (15 mg Intravenous Given 10/20/23 0926)  iohexol (OMNIPAQUE) 300 MG/ML solution 100 mL (100 mLs Intravenous Contrast Given 10/20/23 0913)    ED Course/ Medical Decision Making/ A&P                                 Medical Decision Making Amount and/or Complexity of Data Reviewed External Data Reviewed: notes. Labs: ordered. Decision-making details  documented in ED Course.    Details: pcp Radiology: ordered and independent interpretation performed. Decision-making details documented in ED Course.  Risk Prescription drug management.   Pt with multiple medical problems and comorbidities and presenting today with a complaint that caries a high risk for morbidity and mortality.  Here today with nonspecific back pain and pelvic pain.  Symptoms have been present for 2 weeks and have been gradually worsening not improved with ibuprofen and muscle relaxer given at urgent care last week.  Possibility for musculoskeletal pain as the cause versus renal pathology such as kidney stone but low suspicion for pyelonephritis or UTI versus pelvic pathology such as ovarian mass, but patient has had a hysterectomy so no possibility for pregnancy, also possibility for diverticulitis as symptoms have improved with bowel movements at times.  Patient based on prior medical records did have an elevated sed rate earlier this year and followed up with her rheumatologist but further testing was negative and ANA and other specialized inflammatory test were normal as well as repeat sed rate so low suspicion of that is the cause.  Labs and imaging pending today to rule out the above  10:49 AM I independently interpreted patient's labs and CBC, CMP and UA all within normal limits today.  I have independently visualized and interpreted pt's images today. CT without acute findings such as hydronephrosis or renal stones.  Radiology reports benign adrenal adenoma but no other acute findings.  These findings were discussed with the patient.  At this time we will treat for musculoskeletal cause.  Will try a Medrol Dosepak in addition to the NSAIDs and muscle relaxers patient is trying.  Also discussed with her following up with PCP for physical therapy referral if pain continues.        Final Clinical Impression(s) / ED Diagnoses Final diagnoses:  Acute bilateral low back  pain without sciatica    Rx / DC Orders ED Discharge Orders          Ordered    methylPREDNISolone (MEDROL DOSEPAK) 4 MG TBPK tablet        10/20/23 1045  Gwyneth Sprout, MD 10/20/23 1049

## 2024-04-02 ENCOUNTER — Encounter (HOSPITAL_BASED_OUTPATIENT_CLINIC_OR_DEPARTMENT_OTHER): Payer: Self-pay | Admitting: Emergency Medicine

## 2024-04-02 ENCOUNTER — Emergency Department (HOSPITAL_BASED_OUTPATIENT_CLINIC_OR_DEPARTMENT_OTHER)
Admission: EM | Admit: 2024-04-02 | Discharge: 2024-04-02 | Disposition: A | Discharge status: Home / Self Care | Attending: Emergency Medicine | Admitting: Emergency Medicine

## 2024-04-02 ENCOUNTER — Emergency Department (HOSPITAL_BASED_OUTPATIENT_CLINIC_OR_DEPARTMENT_OTHER)

## 2024-04-02 ENCOUNTER — Other Ambulatory Visit: Payer: Self-pay

## 2024-04-02 DIAGNOSIS — R1013 Epigastric pain: Secondary | ICD-10-CM | POA: Insufficient documentation

## 2024-04-02 DIAGNOSIS — Z91010 Allergy to peanuts: Secondary | ICD-10-CM | POA: Diagnosis not present

## 2024-04-02 DIAGNOSIS — K2900 Acute gastritis without bleeding: Secondary | ICD-10-CM

## 2024-04-02 LAB — URINALYSIS, ROUTINE W REFLEX MICROSCOPIC
Bilirubin Urine: NEGATIVE
Glucose, UA: NEGATIVE mg/dL
Hgb urine dipstick: NEGATIVE
Ketones, ur: NEGATIVE mg/dL
Leukocytes,Ua: NEGATIVE
Nitrite: NEGATIVE
Protein, ur: NEGATIVE mg/dL
Specific Gravity, Urine: 1.006 (ref 1.005–1.030)
pH: 5.5 (ref 5.0–8.0)

## 2024-04-02 LAB — COMPREHENSIVE METABOLIC PANEL WITH GFR
ALT: 6 U/L (ref 0–44)
AST: 14 U/L — ABNORMAL LOW (ref 15–41)
Albumin: 4.2 g/dL (ref 3.5–5.0)
Alkaline Phosphatase: 91 U/L (ref 38–126)
Anion gap: 8 (ref 5–15)
BUN: 13 mg/dL (ref 6–20)
CO2: 26 mmol/L (ref 22–32)
Calcium: 9.1 mg/dL (ref 8.9–10.3)
Chloride: 105 mmol/L (ref 98–111)
Creatinine, Ser: 0.76 mg/dL (ref 0.44–1.00)
GFR, Estimated: >60 mL/min (ref >60)
Glucose, Bld: 93 mg/dL (ref 70–99)
Potassium: 3.5 mmol/L (ref 3.5–5.1)
Sodium: 139 mmol/L (ref 135–145)
Total Bilirubin: 0.3 mg/dL (ref 0.0–1.2)
Total Protein: 7.1 g/dL (ref 6.5–8.1)

## 2024-04-02 LAB — LIPASE, BLOOD: Lipase: <10 U/L — ABNORMAL LOW (ref 11–51)

## 2024-04-02 LAB — CBC
HCT: 37.7 % (ref 36.0–46.0)
Hemoglobin: 12.3 g/dL (ref 12.0–15.0)
MCH: 26.3 pg (ref 26.0–34.0)
MCHC: 32.6 g/dL (ref 30.0–36.0)
MCV: 80.6 fL (ref 80.0–100.0)
Platelets: 274 10*3/uL (ref 150–400)
RBC: 4.68 MIL/uL (ref 3.87–5.11)
RDW: 15.6 % — ABNORMAL HIGH (ref 11.5–15.5)
WBC: 9.4 10*3/uL (ref 4.0–10.5)
nRBC: 0 % (ref 0.0–0.2)

## 2024-04-02 MED ORDER — FAMOTIDINE 20 MG PO TABS: 20.0000 mg | ORAL_TABLET | Freq: Two times a day (BID) | ORAL | 0 refills | Status: AC

## 2024-04-02 MED ORDER — ACETAMINOPHEN 500 MG PO TABS
1000.0000 mg | ORAL_TABLET | Freq: Once | ORAL | Status: AC
  Administered 2024-04-02: 1000 mg via ORAL
  Filled 2024-04-02: qty 2

## 2024-04-02 MED ORDER — FAMOTIDINE 20 MG PO TABS: 20.0000 mg | ORAL_TABLET | Freq: Two times a day (BID) | ORAL | 0 refills | Status: DC

## 2024-04-02 MED ORDER — PANTOPRAZOLE SODIUM 40 MG IV SOLR
40.0000 mg | Freq: Once | INTRAVENOUS | Status: AC
  Administered 2024-04-02: 40 mg via INTRAVENOUS
  Filled 2024-04-02: qty 10

## 2024-04-02 MED ORDER — ALUM & MAG HYDROXIDE-SIMETH 200-200-20 MG/5ML PO SUSP
30.0000 mL | Freq: Once | ORAL | Status: AC
  Administered 2024-04-02: 30 mL via ORAL
  Filled 2024-04-02: qty 30

## 2024-04-02 MED ORDER — LIDOCAINE VISCOUS HCL 2 % MT SOLN
15.0000 mL | Freq: Once | OROMUCOSAL | Status: AC
  Administered 2024-04-02: 15 mL via ORAL
  Filled 2024-04-02: qty 15

## 2024-04-02 NOTE — ED Triage Notes (Signed)
 Pt via pov from home with epigastric/upper abdominal pain x 3 weeks. Pt reports that she was seen by her pcp and had her dosage of omeprazole increased. She states this hasn't helped. Pt denies n/v. Pt alert & oriented, nad noted.

## 2024-04-02 NOTE — ED Provider Notes (Signed)
 [place] EMERGENCY DEPARTMENT AT [facility] Provider Note   CSN: [id number] Arrival date & time: 04/02/24  1751     History  Chief Complaint  Patient presents with   Abdominal Pain    Kristen Ramirez is a 43 y.o. female with PMH as listed below who presents via pov from home with constant epigastric/upper abdominal pain x 3 weeks.  Pain is described as burning.  Had abdominal cramping last week but that is resolved.  Also had constipation last week but that is resolved and has had to daily normal bowel movements since that time.  No ameliorating or aggravating factors that she has noted.  Pt reports that she was seen by her pcp on 4/8 and had her dosage of omeprazole increased, but realized she was already taking the increased dose chronically at home for years.  Drinks alcohol occasionally but not daily.  Smokes 1/2 pack of cigarettes per day.  Has a history of hysterectomy but no other abdominal surgeries.  No prior pregnancy.  Denies any associated nausea/vomiting, fever/chills, back pain, flank pain, urinary symptoms, hematochezia/melena.  Denies frequent NSAID use though takes Excedrin pretty frequently.   Past Medical History:  Diagnosis Date   Vitamin D deficiency        Home Medications Prior to Admission medications   Medication Sig Start Date End Date Taking? Authorizing Provider  diphenhydrAMINE  (BENADRYL ) 25 MG tablet Take 1 tablet (25 mg total) by mouth every 6 (six) hours as needed for up to 5 days (headache). 12/26/22 12/31/22  Gowens, Mariah L, PA-C  ibuprofen  (ADVIL ) 800 MG tablet Take 1 tablet (800 mg total) by mouth every 8 (eight) hours as needed for moderate pain. 05/29/23   Rancour, Mara [place], MD  Lidocaine  (HM LIDOCAINE  PATCH) 4 % PTCH Apply 1 patch topically every 12 (twelve) hours as needed. 02/04/22   Marshal Skeens, PA-C  methylPREDNISolone  (MEDROL  DOSEPAK) 4 MG TBPK tablet Take according to package instructions 10/20/23   Almond Army, MD   metoCLOPramide  (REGLAN ) 10 MG tablet Take 1 tablet (10 mg total) by mouth every 6 (six) hours as needed for up to 5 days for nausea or vomiting (headache). 12/26/22 12/31/22  Gowens, Mariah L, PA-C  Multiple Vitamin (MULTIVITAMIN WITH MINERALS) TABS tablet Take 1 tablet by mouth daily.    [provider]  omeprazole (PRILOSEC) 20 MG capsule Take 20 mg by mouth daily.    [provider]  famotidine  (PEPCID ) 20 MG tablet Take 1 tablet (20 mg total) by mouth 2 (two) times daily. Patient not taking: Reported on 12/25/2020 03/21/20 12/25/20  Fawze, Mina A, PA-C  sucralfate  (CARAFATE ) 1 g tablet Take 1 tablet (1 g total) by mouth 4 (four) times daily -  with meals and at bedtime. Patient not taking: Reported on 12/25/2020 03/21/20 12/25/20  Fawze, Mina A, PA-C      Allergies    Oxycodone , Peanut-containing drug products, Codeine, Hydrocodone, and Peanut butter flavoring agent (non-screening)    Review of Systems   Review of Systems A 10 point review of systems was performed and is negative unless otherwise reported in HPI.  Physical Exam Updated Vital Signs BP 129/80 (BP Location: Right Arm)   Pulse 87   Temp 98.1 F (36.7 C) (Oral)   Resp 18   Ht 5\' 6"  (1.676 m)   Wt 90.7 kg   SpO2 98%   BMI 32.27 kg/m  Physical Exam General: Normal appearing female, lying in bed.  HEENT: Sclera anicteric, MMM, trachea  midline.  Cardiology: RRR, no murmurs/rubs/gallops.  Resp: Normal respiratory rate and effort. CTAB, no wheezes, rhonchi, crackles.  Abd: Soft, mildly tender in epigastric region, non-distended. No rebound tenderness or guarding.  GU: Deferred. MSK: No peripheral edema or signs of trauma. Extremities without deformity or TTP.  Skin: warm, dry.  Back: No CVA tenderness Neuro: A&Ox4, CNs II-XII grossly intact. MAEs. Sensation grossly intact.  Psych: Normal mood and affect.   ED Results / Procedures / Treatments   Labs (all labs ordered are listed, but only abnormal results are  displayed) Labs Reviewed  LIPASE, BLOOD  COMPREHENSIVE METABOLIC PANEL WITH GFR  CBC  URINALYSIS, ROUTINE W REFLEX MICROSCOPIC    EKG EKG Interpretation Date/Time:  Sunday April 02 2024 18:17:21 EDT Ventricular Rate:  83 PR Interval:  162 QRS Duration:  90 QT Interval:  389 QTC Calculation: 458 R Axis:   43  Text Interpretation: Sinus rhythm Confirmed by Annita Kindle [phone]) on 04/02/2024 6:23:44 PM  Radiology No results found.  Procedures Procedures    Medications Ordered in ED Medications  pantoprazole  (PROTONIX ) injection 40 mg (has no administration in time range)  alum & mag hydroxide-simeth (MAALOX/MYLANTA) 200-200-20 MG/5ML suspension 30 mL (30 mLs Oral Given 04/02/24 1856)    And  lidocaine  (XYLOCAINE ) 2 % viscous mouth solution 15 mL (15 mLs Oral Given 04/02/24 1856)  acetaminophen  (TYLENOL ) tablet 1,000 mg (1,000 mg Oral Given 04/02/24 1855)    ED Course/ Medical Decision Making/ A&P                          Medical Decision Making Amount and/or Complexity of Data Reviewed Labs: ordered. Decision-making details documented in ED Course. Radiology: ordered. Decision-making details documented in ED Course.  Risk OTC drugs. Prescription drug management.    This patient presents to the ED for concern of abdominal pain, this involves an extensive number of treatment options, and is a complaint that carries with it a high risk of complications and morbidity.  I considered the following differential and admission for this acute, potentially life threatening condition. Overall patient is very well appearing, afebrile, HDS.   MDM:    For DDX for abdominal pain includes but is not limited to:  Abdominal exam without peritoneal signs. No evidence of acute abdomen at this time.   Patient with burning epigastric pain most likely gastritis/PUD. She also has some RUQ pain, will get US  to r/o biliary colic. No f/c to indicate cholecystitis or cholangitis. Neg lipase,  likely not pancreatitis. No lower abdominal sxs, lower suspicion for appendicitis, diverticulitis. No c/f bowel obstruction or vascular catastrophe.    Clinical Course as of 04/10/24 1438  Sun Apr 02, 2024  1958 Urinalysis, Routine w reflex microscopic -Urine, Unspecified Source(!) neg [HN]  1958 CBC(!) wnl [HN]  1958 US  Abdomen Limited RUQ (LIVER/GB) Unremarkable examination of the right upper quadrant [HN]  2015 Lipase/CMP unremarkable [HN]  2036 Patient reevaluated no further abdominal pain.  Will recommend adding Pepcid  20 mg twice daily and following up with PCP within 1 to 2 weeks.  Recommended quitting smoking and discussed this extensively.  Recommended alcohol cessation as well.  Recommended no fatty or spicy foods, staying well-hydrated.  Given discharge instructions and return precautions, all questions answered to patient satisfaction. [HN]    Clinical Course User Index [HN] Merdis Stalling, MD    Labs: I Ordered, and personally interpreted labs.  The pertinent results include: Those listed above  Imaging  Studies ordered: I ordered imaging studies including right upper quadrant ultrasound I independently visualized and interpreted imaging. I agree with the radiologist interpretation  Additional history obtained from chart review, friend at bedside  Reevaluation: After the interventions noted above, I reevaluated the patient and found that they have :improved  Social Determinants of Health:  lives independently  Disposition:  DC w/ discharge instructions/return precautions. All questions answered to patient's satisfaction.    Co morbidities that complicate the patient evaluation  Past Medical History:  Diagnosis Date   Vitamin D deficiency      Medicines Meds ordered this encounter  Medications   AND Linked Order Group    alum & mag hydroxide-simeth (MAALOX/MYLANTA) 200-200-20 MG/5ML suspension 30 mL    lidocaine  (XYLOCAINE ) 2 % viscous mouth solution 15 mL    pantoprazole  (PROTONIX ) injection 40 mg   acetaminophen  (TYLENOL ) tablet 1,000 mg    I have reviewed the patients home medicines and have made adjustments as needed  Problem List / ED Course: Problem List Items Addressed This Visit   None               This note was created using dictation software, which may contain spelling or grammatical errors.    Merdis Stalling, MD 04/10/24 [id number]

## 2024-04-02 NOTE — Discharge Instructions (Signed)
 Thank you for coming to [facility]. You were seen for abdominal pain. We did an exam, labs, and imaging, and these showed likely gastritis. Things you can do to improve this are: -Quitting smoking. Please talk to your primary doctor for help with this. -Avoiding spicy, fatty, or acidic foods. Alcohol and caffeine can also worsen gastritis. -Avoiding NSAIDs such as ibuprofen, naproxen, or aspirin. -You can take tylenol 1,000 mg every 8 hours for pain  Please follow up with your primary care provider within 1 week.   Do not hesitate to return to the ED or call 911 if you experience: -Worsening symptoms -Lightheadedness, passing out -Fevers/chills -Anything else that concerns you

## 2024-04-02 NOTE — ED Notes (Signed)
 Pt requested to speak to her provider. Provider was made aware.

## 2024-04-02 NOTE — ED Notes (Signed)
 Patient transported to US . Will get blood work when Pt returns.

## 2024-04-10 ENCOUNTER — Encounter (HOSPITAL_BASED_OUTPATIENT_CLINIC_OR_DEPARTMENT_OTHER): Payer: Self-pay | Admitting: Emergency Medicine

## 2024-04-10 ENCOUNTER — Emergency Department (HOSPITAL_BASED_OUTPATIENT_CLINIC_OR_DEPARTMENT_OTHER)

## 2024-04-10 ENCOUNTER — Other Ambulatory Visit: Payer: Self-pay

## 2024-04-10 ENCOUNTER — Emergency Department (HOSPITAL_BASED_OUTPATIENT_CLINIC_OR_DEPARTMENT_OTHER): Admission: EM | Admit: 2024-04-10 | Discharge: 2024-04-10 | Disposition: A | Discharge status: Home / Self Care

## 2024-04-10 DIAGNOSIS — R1084 Generalized abdominal pain: Secondary | ICD-10-CM | POA: Diagnosis present

## 2024-04-10 DIAGNOSIS — Z91010 Allergy to peanuts: Secondary | ICD-10-CM | POA: Diagnosis not present

## 2024-04-10 DIAGNOSIS — I10 Essential (primary) hypertension: Secondary | ICD-10-CM | POA: Insufficient documentation

## 2024-04-10 DIAGNOSIS — R1011 Right upper quadrant pain: Secondary | ICD-10-CM

## 2024-04-10 LAB — COMPREHENSIVE METABOLIC PANEL WITH GFR
ALT: 8 U/L (ref 0–44)
AST: 15 U/L (ref 15–41)
Albumin: 4.8 g/dL (ref 3.5–5.0)
Alkaline Phosphatase: 108 U/L (ref 38–126)
Anion gap: 8 (ref 5–15)
BUN: 14 mg/dL (ref 6–20)
CO2: 28 mmol/L (ref 22–32)
Calcium: 10.3 mg/dL (ref 8.9–10.3)
Chloride: 101 mmol/L (ref 98–111)
Creatinine, Ser: 0.87 mg/dL (ref 0.44–1.00)
GFR, Estimated: >60 mL/min (ref >60)
Glucose, Bld: 80 mg/dL (ref 70–99)
Potassium: 4.1 mmol/L (ref 3.5–5.1)
Sodium: 137 mmol/L (ref 135–145)
Total Bilirubin: 0.3 mg/dL (ref 0.0–1.2)
Total Protein: 8 g/dL (ref 6.5–8.1)

## 2024-04-10 LAB — CBC WITH DIFFERENTIAL/PLATELET
Abs Immature Granulocytes: 0.01 10*3/uL (ref 0.00–0.07)
Basophils Absolute: 0.1 10*3/uL (ref 0.0–0.1)
Basophils Relative: 1 %
Eosinophils Absolute: 0.1 10*3/uL (ref 0.0–0.5)
Eosinophils Relative: 2 %
HCT: 41.5 % (ref 36.0–46.0)
Hemoglobin: 13.4 g/dL (ref 12.0–15.0)
Immature Granulocytes: 0 %
Lymphocytes Relative: 44 %
Lymphs Abs: 3.1 10*3/uL (ref 0.7–4.0)
MCH: 26.3 pg (ref 26.0–34.0)
MCHC: 32.3 g/dL (ref 30.0–36.0)
MCV: 81.4 fL (ref 80.0–100.0)
Monocytes Absolute: 0.8 10*3/uL (ref 0.1–1.0)
Monocytes Relative: 12 %
Neutro Abs: 2.8 10*3/uL (ref 1.7–7.7)
Neutrophils Relative %: 41 %
Platelets: 300 10*3/uL (ref 150–400)
RBC: 5.1 MIL/uL (ref 3.87–5.11)
RDW: 15.4 % (ref 11.5–15.5)
WBC: 7 10*3/uL (ref 4.0–10.5)
nRBC: 0 % (ref 0.0–0.2)

## 2024-04-10 LAB — LIPASE, BLOOD: Lipase: <10 U/L — ABNORMAL LOW (ref 11–51)

## 2024-04-10 MED ORDER — IOHEXOL 300 MG/ML  SOLN
100.0000 mL | Freq: Once | INTRAMUSCULAR | Status: AC | PRN
  Administered 2024-04-10: 85 mL via INTRAVENOUS

## 2024-04-10 NOTE — ED Notes (Signed)
 Pt discharged home and given discharge paperwork. Opportunities given for questions. Pt verbalizes understanding. PIV removed x1. Jillyn Hidden , RN

## 2024-04-10 NOTE — Discharge Instructions (Signed)
 Today you were seen for right upper quadrant pain.  Please continue taking your Pepcid  as needed.  Please follow-up with GI if your symptoms persist for further evaluation and treatment.  Thank you for letting us  treat you today. After reviewing your labs and imaging, I feel you are safe to go home. Please follow up with your PCP in the next several days and provide them with your records from this visit. Return to the Emergency Room if pain becomes severe or symptoms worsen.

## 2024-04-10 NOTE — ED Triage Notes (Signed)
 C/o R side ABD pain x 1 month. Seen here last week and was dx w/ gastritis. States she wants "another check up"

## 2024-04-10 NOTE — ED Provider Notes (Signed)
 [place] EMERGENCY DEPARTMENT AT [facility] Provider Note   CSN: [id number] Arrival date & time: 04/10/24  4098     History  Chief Complaint  Patient presents with   Abdominal Pain    Kristen Ramirez is a 43 y.o. female past medical history significant for hypertension and GERD presents today for right sided abdominal pain x 1 week.  Patient has had abdominal pain in the epigastric region for approximately 1 month.  Patient was seen last week and diagnosed with gastritis.  Patient reports that her pain is at times all over, in the epigastric region, and the right upper quadrant radiating to her lower rib area.  Patient denies nausea, vomiting, diarrhea, fever, shortness of breath, chest pain, urinary symptoms, or constipation.   Abdominal Pain      Home Medications Prior to Admission medications   Medication Sig Start Date End Date Taking? Authorizing Provider  diphenhydrAMINE  (BENADRYL ) 25 MG tablet Take 1 tablet (25 mg total) by mouth every 6 (six) hours as needed for up to 5 days (headache). 12/26/22 12/31/22  Gowens, Mariah L, PA-C  famotidine  (PEPCID ) 20 MG tablet Take 1 tablet (20 mg total) by mouth 2 (two) times daily. 04/02/24   Merdis Stalling, MD  ibuprofen  (ADVIL ) 800 MG tablet Take 1 tablet (800 mg total) by mouth every 8 (eight) hours as needed for moderate pain. 05/29/23   Rancour, Mara Semin[place], MD  Lidocaine  (HM LIDOCAINE  PATCH) 4 % PTCH Apply 1 patch topically every 12 (twelve) hours as needed. 02/04/22   Marshal Skeens, PA-C  methylPREDNISolone  (MEDROL  DOSEPAK) 4 MG TBPK tablet Take according to package instructions 10/20/23   Almond Army, MD  metoCLOPramide  (REGLAN ) 10 MG tablet Take 1 tablet (10 mg total) by mouth every 6 (six) hours as needed for up to 5 days for nausea or vomiting (headache). 12/26/22 12/31/22  Gowens, Mariah L, PA-C  Multiple Vitamin (MULTIVITAMIN WITH MINERALS) TABS tablet Take 1 tablet by mouth daily.    [provider]   omeprazole (PRILOSEC) 20 MG capsule Take 20 mg by mouth daily.    [provider]  sucralfate  (CARAFATE ) 1 g tablet Take 1 tablet (1 g total) by mouth 4 (four) times daily -  with meals and at bedtime. Patient not taking: Reported on 12/25/2020 03/21/20 12/25/20  Fawze, Mina A, PA-C      Allergies    Oxycodone , Peanut-containing drug products, Codeine, Hydrocodone, and Peanut butter flavoring agent (non-screening)    Review of Systems   Review of Systems  Gastrointestinal:  Positive for abdominal pain.    Physical Exam Updated Vital Signs BP 125/81   Pulse 64   Temp 98.1 F (36.7 C)   Resp 16   SpO2 97%  Physical Exam Vitals and nursing note reviewed.  Constitutional:      General: She is not in acute distress.    Appearance: She is well-developed. She is not ill-appearing, toxic-appearing or diaphoretic.  HENT:     Head: Normocephalic and atraumatic.  Eyes:     Extraocular Movements: Extraocular movements intact.     Conjunctiva/sclera: Conjunctivae normal.  Cardiovascular:     Rate and Rhythm: Normal rate and regular rhythm.     Heart sounds: Normal heart sounds. No murmur heard. Pulmonary:     Effort: Pulmonary effort is normal. No respiratory distress.     Breath sounds: Normal breath sounds.  Abdominal:     General: There is no distension.     Palpations: Abdomen is  soft.     Tenderness: There is generalized abdominal tenderness and tenderness in the right upper quadrant. Negative signs include Murphy's sign, Rovsing's sign and McBurney's sign.  Musculoskeletal:        General: No swelling.     Cervical back: Neck supple.  Skin:    General: Skin is warm and dry.     Capillary Refill: Capillary refill takes less than 2 seconds.  Neurological:     General: No focal deficit present.     Mental Status: She is alert and oriented to person, place, and time.  Psychiatric:        Mood and Affect: Mood normal.     ED Results / Procedures / Treatments    Labs (all labs ordered are listed, but only abnormal results are displayed) Labs Reviewed  LIPASE, BLOOD - Abnormal; Notable for the following components:      Result Value   Lipase <10 (*)    All other components within normal limits  CBC WITH DIFFERENTIAL/PLATELET  COMPREHENSIVE METABOLIC PANEL WITH GFR    EKG None  Radiology CT ABDOMEN PELVIS W CONTRAST Result Date: 04/10/2024 CLINICAL DATA:  Acute non localized abdominal pain EXAM: CT ABDOMEN AND PELVIS WITH CONTRAST TECHNIQUE: Multidetector CT imaging of the abdomen and pelvis was performed using the standard protocol following bolus administration of intravenous contrast. RADIATION DOSE REDUCTION: This exam was performed according to the departmental dose-optimization program which includes automated exposure control, adjustment of the mA and/or kV according to patient size and/or use of iterative reconstruction technique. CONTRAST:  85mL OMNIPAQUE  IOHEXOL  300 MG/ML  SOLN COMPARISON:  CT abdomen pelvis October 20, 2023 FINDINGS: Lower chest: Normal heart size. Lung bases are clear. No pleural effusion. Hepatobiliary: Liver is normal in size and contour. No focal hepatic lesion is identified. Gallbladder is unremarkable. No intrahepatic or extrahepatic biliary ductal dilatation. Pancreas: Unremarkable Spleen: Unremarkable Adrenals/Urinary Tract: Stable 1.5 cm left adrenal nodule most compatible with adenoma. Normal right adrenal gland. Kidneys enhance symmetrically with contrast. Mild atrophy midpole left kidney. Urinary bladder is decompressed. Stomach/Bowel: No abnormal bowel wall thickening or evidence for bowel obstruction. No free fluid or free intraperitoneal air. Normal morphology of the stomach. Vascular/Lymphatic: Normal caliber abdominal aorta. No retroperitoneal lymphadenopathy. Reproductive: Surgically absent uterus. Unremarkable adnexal region. Other: None. Musculoskeletal: Redemonstrated hemangioma L2 vertebral body. No  aggressive or acute appearing osseous lesions. IMPRESSION: 1. No acute process within the abdomen or pelvis. 2. Stable 1.5 cm left adrenal nodule most compatible with adenoma. Electronically Signed   By: Jone Neither M.D.   On: 04/10/2024 13:15    Procedures Procedures    Medications Ordered in ED Medications  iohexol  (OMNIPAQUE ) 300 MG/ML solution 100 mL (85 mLs Intravenous Contrast Given 04/10/24 1214)    ED Course/ Medical Decision Making/ A&P                                 Medical Decision Making Amount and/or Complexity of Data Reviewed Labs: ordered. Radiology: ordered.  Risk Prescription drug management.   This patient presents to the ED for concern of abdominal pain, this involves an extensive number of treatment options, and is a complaint that carries with it a high risk of complications and morbidity.  The differential diagnosis includes gastritis, pancreatitis, appendicitis, choledocholithiasis, acute cholecystitis, diverticulitis, SBO,   Co morbidities that complicate the patient evaluation  GERD and hypertension   Additional history obtained: External records from  outside source obtained and reviewed including primary care and OB/GYN notes   Lab Tests:  I Ordered, and personally interpreted labs.  The pertinent results include:  CBC, CMP, lipase WNL   Imaging Studies ordered:  I ordered imaging studies including CT abdomen pelvis with contrast I independently visualized and interpreted imaging which showed no acute process within the abdomen or pelvis. I agree with the radiologist interpretation   Test / Admission - Considered:  Consider for admission or further workup however patient's vital signs, physical exam, labs, and imaging were reassuring.  Patient advised to continue taking her Pepcid  as needed for GERD.  Patient given GI contact information to follow-up for further evaluation and treatment if her symptoms persist.        Final  Clinical Impression(s) / ED Diagnoses Final diagnoses:  Right upper quadrant pain    Rx / DC Orders ED Discharge Orders     None         Carie Charity, PA-C 04/10/24 1336    Rolinda Climes, DO 04/10/24 1549

## 2024-05-29 NOTE — Progress Notes (Signed)
 ------------------------------------------------------------------------------- Attestation with edits by Toribio Fairy Badder, MD at 05/29/2024  2:28 PM I saw and evaluated the patient, reviewed the fellow's note and updated it as appropriate. I agree with the fellow's findings and plan as documented.  I have personally spent greater than 32 minutes involved in both face-to-face and non-face-to-face activities for this patient on the date of the visit.  Professional time spent includes, but is not limited to, reviewing the medical record, evaluating the patient, independently interpreting results, coordinating care with other providers, and/or time spent personally documenting.  I discussed and answered questions regarding the patient's diagnosis and pain concerns. When appropriate and as designated above, I spent time reviewing imaging findings with patient, utilizing anatomic models for education regarding etiology of pain and interventional therapies, and/or discussed the potential risks vs potential benefits of various treatment options for each specific etiology of pain.  Electronically Signed by: Toribio Badder, MD, Attending Physician 05/29/2024 2:28 PM  -------------------------------------------------------------------------------  [facility] New Patient Consultation Note  Referring Provider: Harlin Lenis, PA-C Primary Care Provider: Madelin Rachel Brought, MD  Kristen Ramirez is a 43 y.o. old female being seen today by Toribio PARAS. Bintrim MD and pain fellow Marolyn Caulk MD at the consultation request of Harlin Lenis RIGGERS for new patient office visit.  Medical records reviewed today for this visit: Family Practice and Gastroenterology  Chief Complaint:  Chief Complaint  Patient presents with  . New Patient  . Abdominal Pain   History of Present Illness:  3 months ago marked the onset of a 1.39-month episode of chronic  abdominal pain.  This is the second time she is experienced this pain, the first time being in 2022 after her COVID-vaccine.  She underwent evaluation with gastroenterology and underwent EGD, CT, HIDA, and ultrasound without finding an obvious cause.  She was referred to us  for evaluation for abdominal wall pain.  Unfortunately today she is pain-free and the pain has not recurred for the last month and a half.    From what she can recall the pain was often brought on by eating something sweet and laying down.  She did not feel like it ran under a particular rib. No skin rash.   Pain Description: The pain started 2-5 years ago after COVID Vaccine. Since the pain first started, it is worse. The most significant location of pain is the upper abdomen. The 1-2 words that best describe the pain: throbbing, aching, burning, and on and off The pain improves with prescription pain medications, rest, and laying down. The pain is made worse with most movements and general daily activities. The average daily pain score is 0/10. The pain can be as high as 10/10 at its worst. The pain interferes with: All activities, Working, and Sleeping.   Therapies: Previously attempted interventions for this pain concern: None   Has physical therapy been initiated or completed for this pain concern? No Has chiropractic been completed for this pain concern? No   Medications: Currently utilized over the counter medications/therapy for pain: None   Currently utilized prescription medications/therapies for pain: None   Currently utilized controlled medications: None   Is the patient on a blood thinner (including aspirin, Goody/BC/Bayer powder)? No ____________________________________________________________________ Past Medical History: Medical History[1]   Past Surgical History: Surgical History[2]  Family History: Family History[3]  Social History: Social History   Tobacco Use  . Smoking status: Former     Current packs/day: 0.25  Average packs/day: 0.3 packs/day for 1.4 years (0.4 ttl pk-yrs)    Types: Cigarettes    Start date: 2024    Quit date: 01/20/2022    Passive exposure: Never  . Smokeless tobacco: Never  Substance Use Topics  . Alcohol use: Yes    Comment: socially    Allergies: Codeine and Hydrocodone  Current Medications: Current Medications[4]  Review of Systems:  Pertinent positive/negative findings related to today's visit are noted in the HPI and Assessment/Plan.  ____________________________________________________________________ Vitals: Vitals:   05/29/24 1151  BP: 127/89  BP Location: Left arm  Patient Position: Sitting  Pulse: 71  Resp: 20  Temp: 98.7 F (37.1 C)  TempSrc: Temporal  SpO2: 99%   Physical Exam: Constitutional: Well developed, Well nourished, No acute distress and Interactive. General: Patient is alert and orientated. Psychological: The patient's mood is normal, and appropriate for the circumstances.  ____________________________________________________________________ Controlled Substance Monitoring: [place]CPDMP: Reviewed Today ____________________________________________________________________ Imaging and Diagnostic Studies:  All applicable diagnostic studies related to this consultation have been reviewed. Relevant diagnostic reports and/or my personal review of imaging or other diagnostic studies listed below: [facility], 04/17/2024 9:53 AM   INDICATION: Biliary colic, recurrent, gallbladder dyskinesia suspected, Right upper quadrant pain \ R10.11 Right upper quadrant pain  COMPARISON: None.   TECHNIQUE: Anterior images of the liver and upper abdomen were obtained 60 minutes following intravenous administration of 5.5 mCi of Tc49m-mebrofenin. Then, 0.9 mcg intravenous CCK (sincalide) was administered over 45 minutes while anterior dynamic images were obtained. Region of interest analysis was performed over the  gallbladder and a gallbladder ejection fraction was calculated.    LIMITATIONS: None.    FINDINGS:    Initial images: Liver, gallbladder and bowel activity are visualized.   Dynamic images: The gallbladder empties with a calculated gallbladder ejection fraction of 64%. (Normal for this study is >38%.)   Enterogastric reflux of bile: Not present.   IMPRESSION: Normal gallbladder ejection fraction. No evidence of biliary dyskinesia.   CT ABDOMEN AND PELVIS WITH CONTRAST   TECHNIQUE:  Multidetector CT imaging of the abdomen and pelvis was performed  using the standard protocol following bolus administration of  intravenous contrast.   RADIATION DOSE REDUCTION: This exam was performed according to the  departmental dose-optimization program which includes automated  exposure control, adjustment of the mA and/or kV according to  patient size and/or use of iterative reconstruction technique.   CONTRAST:  85mL OMNIPAQUE  IOHEXOL  300 MG/ML  SOLN   COMPARISON:  CT abdomen pelvis October 20, 2023   FINDINGS:  Lower chest: Normal heart size. Lung bases are clear. No pleural  effusion.   Hepatobiliary: Liver is normal in size and contour. No focal hepatic  lesion is identified. Gallbladder is unremarkable. No intrahepatic  or extrahepatic biliary ductal dilatation.   Pancreas: Unremarkable   Spleen: Unremarkable   Adrenals/Urinary Tract: Stable 1.5 cm left adrenal nodule most  compatible with adenoma. Normal right adrenal gland. Kidneys enhance  symmetrically with contrast. Mild atrophy midpole left kidney.  Urinary bladder is decompressed.   Stomach/Bowel: No abnormal bowel wall thickening or evidence for  bowel obstruction. No free fluid or free intraperitoneal air. Normal  morphology of the stomach.   Vascular/Lymphatic: Normal caliber abdominal aorta. No  retroperitoneal lymphadenopathy.   Reproductive: Surgically absent uterus. Unremarkable adnexal region.   Other:  None.   Musculoskeletal: Redemonstrated hemangioma L2 vertebral body. No  aggressive or acute appearing osseous lesions.   IMPRESSION:  1. No acute process within the  abdomen or pelvis.  2. Stable 1.5 cm left adrenal nodule most compatible with adenoma.  I personally reviewed images, radiology results, and or diagnostic study results with the patient today. ____________________________________________________________________ Relevant Labs Reviewed: Below labs reviewed  Lab Results  Component Value Date   CREATININE 0.80 11/01/2023    Lab Results  Component Value Date   WBC 8.80 02/29/2024   HGB 13.7 03/28/2024   HCT 41.2 03/28/2024   MCV 81.5 02/29/2024   PLT 299 02/29/2024    Lab Results  Component Value Date   HGBA1C 6.0 (H) 02/29/2024    ____________________________________________________________________ Diagnosis: 1. Acute bilateral upper abdominal pain        Assessment: Channel Papandrea is a 43 y.o. old female referred from Harlin Lenis, [place] presenting for new patient consultation for a 1.74-month episode of acute upper abdominal pain.  Fortunately the pain resolved a month and a half ago and has not recurred.  The gastroenterology team that referred her believed her pain could be stemming from abdominal wall pain, and so I spoke with her about trigger point injections and tap blocks for if the pain were to return.  As her pain is not present no procedures were planned today.  Plan: Interventional treatments: -TAP block vs TPI for upper abdominal pain bilaterally in the future if pain returns. She may reach out to request this if needed into the future -Risk factors of the above listed minor surgery/procedures were discussed with the patient. Risk factors discussed at today's visit include, but are not limited to, bleeding, infection, nerve damage, injury to adjacent structures which includes the spinal cord and/or spinal nerve roots for spine based  procedures.   Medication recommendations: -None  Imaging/diagnostic studies recommended: -None  Consults/referrals placed: -None  Follow up recommendation: -PRN  Treatment plan fully discussed and agreed upon with the patient. All questions were answered to her satisfaction today.  Electronically signed by:  Marsa Murray Caulk, MD 05/29/2024 11:56 AM      [1] Past Medical History: Diagnosis Date  . Acute recurrent ethmoidal sinusitis 01/16/2019  . Allergic   . Allergy to pollen 01/16/2019  . Endometriosis   . Hypertension   . Migraine with aura and without status migrainosus, not intractable 01/16/2019  . Muscle tension headache 01/16/2019  . Sacroiliac joint pain 12/03/2022  [2] Past Surgical History: Procedure Laterality Date  . HYSTERECTOMY      Procedure: HYSTERECTOMY  . OOPHORECTOMY     Procedure: OOPHORECTOMY  . OTHER SURGICAL HISTORY     Procedure: OTHER SURGICAL HISTORY (vulvar lesion removal)  . TOTAL VAGINAL HYSTERECTOMY    [3] Family History Problem Relation Name Age of Onset  . Breast cancer Mother    . Hypertension Mother    . Diabetes Maternal Grandmother    . Stroke Maternal Grandmother    . Hypertension Maternal Grandmother    . Kidney disease Maternal Grandfather    . Hypertension Maternal Grandfather    [4] Current Outpatient Medications  Medication Sig Dispense Refill  . amitriptyline (ELAVIL) 25 mg tablet Take 25 mg by mouth nightly as needed.    SABRA amlodipine-valsartan 5-160 mg tab 1 (ONE) TABLET IN THE AM FOR BLOOD PRESSURE 90 tablet 1  . cholecalciferol (VITAMIN D3) 10 mcg (400 unit) tablet Take by mouth.    . fluticasone propionate (FLONASE) 50 mcg/spray nasal spray     . lubiprostone (AMITIZA) 8 mcg capsule TAKE 1 CAPSULE BY MOUTH IN THE MORNING AND 1 CAPSULE IN THE EVENING.  TAKE WITH MEALS. 180 capsule 1  . predniSONE (STERAPRED DS) 10 mg DsPk dose pack Take by mouth once.    . promethazine-dextromethorphan (PHENERGAN DM) 6.25-15  mg/5 mL syrp syrup Take 5 mL by mouth every 4 (four) hours as needed.    . RABEprazole (ACIPHEX) 20 mg DR tablet Take 1 tablet (20 mg total) by mouth before breakfast and before evening meal. 90 tablet 3   No current facility-administered medications for this visit.

## 2024-07-30 ENCOUNTER — Emergency Department (HOSPITAL_BASED_OUTPATIENT_CLINIC_OR_DEPARTMENT_OTHER)
Admission: EM | Admit: 2024-07-30 | Discharge: 2024-07-30 | Disposition: A | Discharge status: Home / Self Care | Attending: Emergency Medicine | Admitting: Emergency Medicine

## 2024-07-30 ENCOUNTER — Encounter (HOSPITAL_BASED_OUTPATIENT_CLINIC_OR_DEPARTMENT_OTHER): Payer: Self-pay

## 2024-07-30 ENCOUNTER — Emergency Department (HOSPITAL_BASED_OUTPATIENT_CLINIC_OR_DEPARTMENT_OTHER): Admitting: Radiology

## 2024-07-30 ENCOUNTER — Other Ambulatory Visit: Payer: Self-pay

## 2024-07-30 DIAGNOSIS — M545 Low back pain, unspecified: Secondary | ICD-10-CM | POA: Diagnosis present

## 2024-07-30 DIAGNOSIS — Z91010 Allergy to peanuts: Secondary | ICD-10-CM | POA: Insufficient documentation

## 2024-07-30 DIAGNOSIS — M549 Dorsalgia, unspecified: Secondary | ICD-10-CM

## 2024-07-30 LAB — URINALYSIS, ROUTINE W REFLEX MICROSCOPIC
Bacteria, UA: NONE SEEN
Bilirubin Urine: NEGATIVE
Glucose, UA: NEGATIVE mg/dL
Hgb urine dipstick: NEGATIVE
Ketones, ur: NEGATIVE mg/dL
Leukocytes,Ua: NEGATIVE
Nitrite: NEGATIVE
Specific Gravity, Urine: 1.031 — ABNORMAL HIGH (ref 1.005–1.030)
pH: 5.5 (ref 5.0–8.0)

## 2024-07-30 MED ORDER — METAXALONE 800 MG PO TABS: 800.0000 mg | ORAL_TABLET | Freq: Three times a day (TID) | ORAL | 0 refills | Status: DC

## 2024-07-30 MED ORDER — METHOCARBAMOL 500 MG PO TABS: 500.0000 mg | ORAL_TABLET | Freq: Two times a day (BID) | ORAL | 0 refills | Status: AC

## 2024-07-30 MED ORDER — METHYLPREDNISOLONE SODIUM SUCC 125 MG IJ SOLR
125.0000 mg | Freq: Once | INTRAMUSCULAR | Status: AC
  Administered 2024-07-30: 125 mg via INTRAMUSCULAR
  Filled 2024-07-30: qty 2

## 2024-07-30 MED ORDER — LIDOCAINE 5 % EX PTCH: 1.0000 | MEDICATED_PATCH | CUTANEOUS | 0 refills | Status: DC

## 2024-07-30 MED ORDER — LIDOCAINE 5 % EX PTCH: 1.0000 | MEDICATED_PATCH | CUTANEOUS | 0 refills | Status: AC

## 2024-07-30 MED ORDER — METAXALONE 800 MG PO TABS: 800.0000 mg | ORAL_TABLET | Freq: Three times a day (TID) | ORAL | 0 refills | Status: AC

## 2024-07-30 MED ORDER — LIDOCAINE 5 % EX PTCH
1.0000 | MEDICATED_PATCH | CUTANEOUS | Status: DC
  Administered 2024-07-30: 1 via TRANSDERMAL
  Filled 2024-07-30: qty 1

## 2024-07-30 NOTE — ED Triage Notes (Signed)
 Pt presents via POV c/o lower back back since Wednesday PM. Denies injury. 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

## 2024-07-30 NOTE — ED Provider Notes (Addendum)
 [place] EMERGENCY DEPARTMENT AT [facility] Provider Note   CSN: [id number] Arrival date & time: 07/30/24  1902     Patient presents with: Back Pain   Kristen Ramirez is a 43 y.o. female presents today for lower back pain that began Wednesday night.  Patient denies injury.  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  Patient denies numbness, tingling, loss of bowel or bladder control, fever, chills, saddle anesthesia, or weakness.    Back Pain      Prior to Admission medications   Medication Sig Start Date End Date Taking? Authorizing Provider  lidocaine  (LIDODERM ) 5 % Place 1 patch onto the skin daily. Remove & Discard patch within 12 hours or as directed by MD 07/30/24  Yes Ashtan Laton N, PA-C  metaxalone  (SKELAXIN ) 800 MG tablet Take 1 tablet (800 mg total) by mouth 3 (three) times daily. 07/30/24  Yes Hazael Olveda N, PA-C  diphenhydrAMINE  (BENADRYL ) 25 MG tablet Take 1 tablet (25 mg total) by mouth every 6 (six) hours as needed for up to 5 days (headache). 12/26/22 12/31/22  Gowens, Mariah L, PA-C  famotidine  (PEPCID ) 20 MG tablet Take 1 tablet (20 mg total) by mouth 2 (two) times daily. 04/02/24   Franklyn Sid SAILOR, MD  ibuprofen  (ADVIL ) 800 MG tablet Take 1 tablet (800 mg total) by mouth every 8 (eight) hours as needed for moderate pain. 05/29/23   Rancour, Garnette, MD  Lidocaine  (HM LIDOCAINE  PATCH) 4 % PTCH Apply 1 patch topically every 12 (twelve) hours as needed. 02/04/22   Eudelia Maude SAUNDERS, PA-C  methylPREDNISolone  (MEDROL  DOSEPAK) 4 MG TBPK tablet Take according to package instructions 10/20/23   Doretha Folks, MD  metoCLOPramide  (REGLAN ) 10 MG tablet Take 1 tablet (10 mg total) by mouth every 6 (six) hours as needed for up to 5 days for nausea or vomiting (headache). 12/26/22 12/31/22  Gowens, Mariah L, PA-C  Multiple Vitamin (MULTIVITAMIN WITH MINERALS) TABS tablet Take 1 tablet by mouth daily.    [provider]  omeprazole (PRILOSEC) 20 MG capsule  Take 20 mg by mouth daily.    [provider]  sucralfate  (CARAFATE ) 1 g tablet Take 1 tablet (1 g total) by mouth 4 (four) times daily -  with meals and at bedtime. Patient not taking: Reported on 12/25/2020 03/21/20 12/25/20  Fawze, Mina A, PA-C    Allergies: Oxycodone , Peanut-containing drug products, Codeine, Hydrocodone, and Peanut butter flavoring agent (non-screening)    Review of Systems  Musculoskeletal:  Positive for back pain.    Updated Vital Signs BP 120/79 (BP Location: Right Arm)   Pulse 84   Temp 97.6 F (36.4 C)   Resp 19   SpO2 97%   Physical Exam Vitals and nursing note reviewed.  Constitutional:      General: She is not in acute distress.    Appearance: She is well-developed.  HENT:     Head: Normocephalic and atraumatic.  Eyes:     Conjunctiva/sclera: Conjunctivae normal.  Cardiovascular:     Rate and Rhythm: Normal rate and regular rhythm.     Heart sounds: No murmur heard. Pulmonary:     Effort: Pulmonary effort is normal. No respiratory distress.     Breath sounds: Normal breath sounds.  Abdominal:     Palpations: Abdomen is soft.     Tenderness: There is no abdominal tenderness.  Musculoskeletal:        General: No swelling.     Cervical back: Neck supple.  Skin:    General: Skin is warm and dry.     Capillary Refill: Capillary refill takes less than 2 seconds.  Neurological:     Mental Status: She is alert.  Psychiatric:        Mood and Affect: Mood normal.     (all labs ordered are listed, but only abnormal results are displayed) Labs Reviewed  URINALYSIS, ROUTINE W REFLEX MICROSCOPIC - Abnormal; Notable for the following components:      Result Value   Specific Gravity, Urine 1.031 (*)    Protein, ur TRACE (*)    All other components within normal limits    EKG: None  Radiology: DG Lumbar Spine Complete Result Date: 07/30/2024 CLINICAL DATA:  Low back pain EXAM: LUMBAR SPINE - COMPLETE 4+ VIEW COMPARISON:  None Available.  FINDINGS: There is no evidence of lumbar spine fracture. Alignment is normal. Intervertebral disc spaces are maintained. IMPRESSION: Negative. Electronically Signed   By: Dorethia Molt M.D.   On: 07/30/2024 21:31     Procedures   Medications Ordered in the ED  lidocaine  (LIDODERM ) 5 % 1 patch (1 patch Transdermal Patch Applied 07/30/24 2131)  methylPREDNISolone  sodium succinate (SOLU-MEDROL ) 125 mg/2 mL injection 125 mg (125 mg Intramuscular Given 07/30/24 2132)                                    Medical Decision Making Amount and/or Complexity of Data Reviewed Labs: ordered. Radiology: ordered.  Risk Prescription drug management.   This patient presents to the ED for concern of back pain differential diagnosis includes muscle strain, sciatica, cauda equina syndrome, epidural abscess, spinal cord injury    Additional history obtained   Additional history obtained from Electronic Medical Record External records from outside source obtained and reviewed including Care Everywhere  Lab Tests:  I Ordered, and personally interpreted labs.  The pertinent results include: UA with trace protein and elevated specific gravity   Imaging Studies ordered:  I ordered imaging studies including lumbar x-ray I independently visualized and interpreted imaging which showed negative I agree with the radiologist interpretation   Medicines ordered and prescription drug management:  I ordered medication including Solu-Medrol  IM and Lidoderm  patch    I have reviewed the patients home medicines and have made adjustments as needed   Problem List / ED Course:  Considered for admission or further workup however patient's vital signs, physical exam, and imaging are reassuring.  Patient given IM prednisone while in ED.  Patient given outpatient course of muscle relaxers and Lidoderm  patches and advised to take Tylenol  and Motrin  as needed.  Patient to follow-up with spine for further evaluation  and workup if her symptoms persist.  I feel patient safe for discharge at this time.       Final diagnoses:  Back pain, unspecified back location, unspecified back pain laterality, unspecified chronicity    ED Discharge Orders          Ordered    metaxalone  (SKELAXIN ) 800 MG tablet  3 times daily        07/30/24 2135    lidocaine  (LIDODERM ) 5 %  Every 24 hours        07/30/24 2135               Francis Ileana SAILOR, PA-C 07/30/24 2135    Francis Ileana SAILOR, PA-C 07/30/24 2135    Geraldene Hamilton, MD 07/31/24 [id number]

## 2024-07-30 NOTE — Discharge Instructions (Addendum)
 Today you were seen for back pain.  Please alternate taking Tylenol /Motrin  as needed for pain.  Please pick up your muscle relaxer and Lidoderm  patches and take as prescribed.  Please do not drive while taking muscle relaxers as they may make you drowsy.  Thank you for letting us  treat you today. After performing a physical exam, I feel you are safe to go home. Please follow up with your PCP in the next several days and provide them with your records from this visit. Return to the Emergency Room if pain becomes severe or symptoms worsen.
